# Patient Record
Sex: Female | Born: 1965 | Race: White | Hispanic: No | Marital: Single | Smoking: Never smoker
Health system: Southern US, Community
[De-identification: ages and names within clinical notes are randomized; demographics above are authoritative.]

## PROBLEM LIST (undated history)

## (undated) DIAGNOSIS — L80 Vitiligo: Secondary | ICD-10-CM

## (undated) DIAGNOSIS — D219 Benign neoplasm of connective and other soft tissue, unspecified: Secondary | ICD-10-CM

## (undated) DIAGNOSIS — F32A Depression, unspecified: Secondary | ICD-10-CM

## (undated) DIAGNOSIS — Z9189 Other specified personal risk factors, not elsewhere classified: Secondary | ICD-10-CM

## (undated) DIAGNOSIS — M8580 Other specified disorders of bone density and structure, unspecified site: Secondary | ICD-10-CM

## (undated) DIAGNOSIS — F909 Attention-deficit hyperactivity disorder, unspecified type: Secondary | ICD-10-CM

## (undated) DIAGNOSIS — K635 Polyp of colon: Secondary | ICD-10-CM

## (undated) DIAGNOSIS — Z9109 Other allergy status, other than to drugs and biological substances: Secondary | ICD-10-CM

## (undated) DIAGNOSIS — F329 Major depressive disorder, single episode, unspecified: Secondary | ICD-10-CM

## (undated) HISTORY — PX: APPENDECTOMY: SHX54

## (undated) HISTORY — DX: Depression, unspecified: F32.A

## (undated) HISTORY — DX: Benign neoplasm of connective and other soft tissue, unspecified: D21.9

## (undated) HISTORY — DX: Major depressive disorder, single episode, unspecified: F32.9

## (undated) HISTORY — DX: Polyp of colon: K63.5

## (undated) HISTORY — DX: Other specified personal risk factors, not elsewhere classified: Z91.89

## (undated) HISTORY — DX: Vitiligo: L80

## (undated) HISTORY — DX: Other specified disorders of bone density and structure, unspecified site: M85.80

## (undated) HISTORY — PX: MOUTH SURGERY: SHX715

## (undated) HISTORY — DX: Other allergy status, other than to drugs and biological substances: Z91.09

## (undated) HISTORY — DX: Attention-deficit hyperactivity disorder, unspecified type: F90.9

---

## 2006-07-07 ENCOUNTER — Other Ambulatory Visit: Admission: RE | Admit: 2006-07-07 | Discharge: 2006-07-07 | Payer: Self-pay | Admitting: Gynecology

## 2007-07-21 ENCOUNTER — Other Ambulatory Visit: Admission: RE | Admit: 2007-07-21 | Discharge: 2007-07-21 | Payer: Self-pay | Admitting: Gynecology

## 2007-09-08 ENCOUNTER — Ambulatory Visit (HOSPITAL_COMMUNITY): Admission: RE | Admit: 2007-09-08 | Discharge: 2007-09-08 | Payer: Self-pay | Admitting: Gynecology

## 2007-09-20 ENCOUNTER — Encounter: Admission: RE | Admit: 2007-09-20 | Discharge: 2007-09-20 | Payer: Self-pay | Admitting: Gynecology

## 2008-07-31 ENCOUNTER — Ambulatory Visit: Payer: Self-pay | Admitting: Gynecology

## 2008-07-31 ENCOUNTER — Other Ambulatory Visit: Admission: RE | Admit: 2008-07-31 | Discharge: 2008-07-31 | Payer: Self-pay | Admitting: Gynecology

## 2008-07-31 ENCOUNTER — Encounter: Payer: Self-pay | Admitting: Gynecology

## 2008-08-05 ENCOUNTER — Ambulatory Visit: Payer: Self-pay | Admitting: Gynecology

## 2008-10-04 ENCOUNTER — Ambulatory Visit (HOSPITAL_COMMUNITY): Admission: RE | Admit: 2008-10-04 | Discharge: 2008-10-04 | Payer: Self-pay | Admitting: Gynecology

## 2009-08-04 ENCOUNTER — Other Ambulatory Visit: Admission: RE | Admit: 2009-08-04 | Discharge: 2009-08-04 | Payer: Self-pay | Admitting: Gynecology

## 2009-08-04 ENCOUNTER — Ambulatory Visit: Payer: Self-pay | Admitting: Gynecology

## 2009-10-23 ENCOUNTER — Ambulatory Visit (HOSPITAL_COMMUNITY): Admission: RE | Admit: 2009-10-23 | Discharge: 2009-10-23 | Payer: Self-pay | Admitting: Gynecology

## 2009-11-30 ENCOUNTER — Emergency Department (HOSPITAL_COMMUNITY): Admission: AC | Admit: 2009-11-30 | Discharge: 2009-11-30 | Payer: Self-pay | Admitting: Emergency Medicine

## 2009-12-21 ENCOUNTER — Ambulatory Visit (HOSPITAL_COMMUNITY): Admission: RE | Admit: 2009-12-21 | Discharge: 2009-12-21 | Payer: Self-pay | Admitting: Chiropractic Medicine

## 2010-02-14 DIAGNOSIS — K635 Polyp of colon: Secondary | ICD-10-CM

## 2010-02-14 HISTORY — DX: Polyp of colon: K63.5

## 2010-05-17 DIAGNOSIS — M8580 Other specified disorders of bone density and structure, unspecified site: Secondary | ICD-10-CM

## 2010-05-17 HISTORY — DX: Other specified disorders of bone density and structure, unspecified site: M85.80

## 2010-06-07 ENCOUNTER — Encounter: Payer: Self-pay | Admitting: Gynecology

## 2010-08-26 ENCOUNTER — Other Ambulatory Visit (HOSPITAL_COMMUNITY)
Admission: RE | Admit: 2010-08-26 | Discharge: 2010-08-26 | Disposition: A | Discharge status: Home / Self Care | Payer: 59 | Source: Ambulatory Visit | Attending: Gynecology | Admitting: Gynecology

## 2010-08-26 ENCOUNTER — Other Ambulatory Visit: Payer: Self-pay | Admitting: Gynecology

## 2010-08-26 ENCOUNTER — Encounter (INDEPENDENT_AMBULATORY_CARE_PROVIDER_SITE_OTHER): Payer: Self-pay | Admitting: Gynecology

## 2010-08-26 DIAGNOSIS — Z124 Encounter for screening for malignant neoplasm of cervix: Secondary | ICD-10-CM | POA: Insufficient documentation

## 2010-08-26 DIAGNOSIS — Z13220 Encounter for screening for lipoid disorders: Secondary | ICD-10-CM

## 2010-08-26 DIAGNOSIS — Z01419 Encounter for gynecological examination (general) (routine) without abnormal findings: Secondary | ICD-10-CM

## 2010-09-01 ENCOUNTER — Encounter (INDEPENDENT_AMBULATORY_CARE_PROVIDER_SITE_OTHER): Payer: 59

## 2010-09-01 DIAGNOSIS — M899 Disorder of bone, unspecified: Secondary | ICD-10-CM

## 2010-09-08 ENCOUNTER — Other Ambulatory Visit: Payer: Self-pay | Admitting: Gynecology

## 2010-09-08 ENCOUNTER — Ambulatory Visit (INDEPENDENT_AMBULATORY_CARE_PROVIDER_SITE_OTHER): Payer: 59 | Admitting: Gynecology

## 2010-09-08 DIAGNOSIS — N9089 Other specified noninflammatory disorders of vulva and perineum: Secondary | ICD-10-CM

## 2010-10-06 ENCOUNTER — Other Ambulatory Visit: Payer: Self-pay | Admitting: Gynecology

## 2010-10-06 DIAGNOSIS — Z1231 Encounter for screening mammogram for malignant neoplasm of breast: Secondary | ICD-10-CM

## 2010-10-26 ENCOUNTER — Ambulatory Visit (HOSPITAL_COMMUNITY)
Admission: RE | Admit: 2010-10-26 | Discharge: 2010-10-26 | Disposition: A | Discharge status: Home / Self Care | Payer: 59 | Source: Ambulatory Visit | Attending: Gynecology | Admitting: Gynecology

## 2010-10-26 DIAGNOSIS — Z1231 Encounter for screening mammogram for malignant neoplasm of breast: Secondary | ICD-10-CM | POA: Insufficient documentation

## 2011-05-30 ENCOUNTER — Other Ambulatory Visit: Payer: Self-pay | Admitting: Gynecology

## 2011-08-26 ENCOUNTER — Encounter: Payer: Self-pay | Admitting: *Deleted

## 2011-09-02 ENCOUNTER — Ambulatory Visit (INDEPENDENT_AMBULATORY_CARE_PROVIDER_SITE_OTHER): Payer: BC Managed Care – PPO | Admitting: Gynecology

## 2011-09-02 ENCOUNTER — Encounter: Payer: Self-pay | Admitting: Gynecology

## 2011-09-02 VITALS — BP 130/80 | Ht 67.5 in | Wt 181.0 lb

## 2011-09-02 DIAGNOSIS — Z01419 Encounter for gynecological examination (general) (routine) without abnormal findings: Secondary | ICD-10-CM

## 2011-09-02 MED ORDER — NORETHINDRONE ACET-ETHINYL EST 1-20 MG-MCG PO TABS: 1.0000 | ORAL_TABLET | Freq: Every day | ORAL | Status: DC

## 2011-09-02 NOTE — Patient Instructions (Signed)
Follow up in one year for your annual gynecologic exam. 

## 2011-09-02 NOTE — Progress Notes (Signed)
Eileen Donovan [birth year] [id number]        46 y.o.  for annual exam.  Doing well.  Past medical history,surgical history, medications, allergies, family history and social history were all reviewed and documented in the EPIC chart. ROS:  Was performed and pertinent positives and negatives are included in the history.  Exam: Amy chaperone present Filed Vitals:   09/02/11 1546  BP: 130/80   General appearance  Normal Skin grossly normal Head/Neck normal with no cervical or supraclavicular adenopathy thyroid normal Lungs  clear Cardiac RR, without RMG Abdominal  soft, nontender, without masses, organomegaly or hernia Breasts  examined lying and sitting without masses, retractions, discharge or axillary adenopathy. Pelvic  Ext/BUS/vagina  normal   Cervix  normal    Uterus  anteverted, normal size, shape and contour, midline and mobile nontender   Adnexa  Without masses or tenderness    Anus and perineum  normal   Rectovaginal  normal sphincter tone without palpated masses or tenderness.    Assessment/Plan:  46 y.o. female for annual exam.    1. Oral contraceptives. Patient using Loestrin 120 equivalent doing well wants to continue. I reviewed the risks particularly with advancing age to include stroke heart attack DVT. She is not being followed for her medical issues, does not smoke and accepts the risks and I refilled her times a year. 2. Breast health. SBE monthly reviewed. She is due for mammogram in June and I reminded her to schedule this and she'll continue with annual mammography. 3. Pap smear. Her last Pap smear was 2012. She has no history of abnormal Pap smears before with several normal records in her chart. I reviewed current screening guidelines we'll plan on every three-year Pap smears and no Pap smear was done today. 4. Health maintenance. No blood work was done today she reports having all done through her primary physician's office. She will see me in a year assuming she  continues well, sooner as needed.    Dara Lords MD, 4:28 PM 09/02/2011

## 2011-12-13 ENCOUNTER — Other Ambulatory Visit: Payer: Self-pay | Admitting: Gynecology

## 2011-12-13 DIAGNOSIS — Z1231 Encounter for screening mammogram for malignant neoplasm of breast: Secondary | ICD-10-CM

## 2011-12-30 ENCOUNTER — Ambulatory Visit (HOSPITAL_COMMUNITY)
Admission: RE | Admit: 2011-12-30 | Discharge: 2011-12-30 | Disposition: A | Discharge status: Home / Self Care | Payer: BC Managed Care – PPO | Source: Ambulatory Visit | Attending: Gynecology | Admitting: Gynecology

## 2011-12-30 DIAGNOSIS — Z1231 Encounter for screening mammogram for malignant neoplasm of breast: Secondary | ICD-10-CM

## 2012-03-14 ENCOUNTER — Other Ambulatory Visit: Payer: Self-pay | Admitting: Gynecology

## 2012-04-10 ENCOUNTER — Other Ambulatory Visit: Payer: Self-pay | Admitting: Gynecology

## 2012-10-10 ENCOUNTER — Other Ambulatory Visit: Payer: Self-pay | Admitting: Gynecology

## 2012-10-24 ENCOUNTER — Ambulatory Visit (INDEPENDENT_AMBULATORY_CARE_PROVIDER_SITE_OTHER): Payer: BC Managed Care – PPO | Admitting: Gynecology

## 2012-10-24 ENCOUNTER — Encounter: Payer: Self-pay | Admitting: Gynecology

## 2012-10-24 VITALS — BP 120/70 | Ht 67.5 in | Wt 188.0 lb

## 2012-10-24 DIAGNOSIS — M899 Disorder of bone, unspecified: Secondary | ICD-10-CM

## 2012-10-24 DIAGNOSIS — M949 Disorder of cartilage, unspecified: Secondary | ICD-10-CM

## 2012-10-24 DIAGNOSIS — M8580 Other specified disorders of bone density and structure, unspecified site: Secondary | ICD-10-CM

## 2012-10-24 DIAGNOSIS — Z01419 Encounter for gynecological examination (general) (routine) without abnormal findings: Secondary | ICD-10-CM

## 2012-10-24 MED ORDER — NORETHINDRONE ACET-ETHINYL EST 1-20 MG-MCG PO TABS: 1.0000 | ORAL_TABLET | Freq: Every day | ORAL | Status: DC

## 2012-10-24 NOTE — Progress Notes (Signed)
EMERALD SHOR [birth year] [id number]        47 y.o.  G0P0 for annual exam.  Doing well without complaints.  Past medical history,surgical history, medications, allergies, family history and social history were all reviewed and documented in the EPIC chart.  ROS:  Performed and pertinent positives and negatives are included in the history, assessment and plan .  Exam: Kim assistant Filed Vitals:   10/24/12 1544  BP: 120/70  Height: 5' 7.5" (1.715 m)  Weight: 188 lb (85.276 kg)   General appearance  Normal Skin grossly normal Head/Neck normal with no cervical or supraclavicular adenopathy thyroid normal Lungs  clear Cardiac RR, without RMG Abdominal  soft, nontender, without masses, organomegaly or hernia Breasts  examined lying and sitting without masses, retractions, discharge or axillary adenopathy. Pelvic  Ext/BUS/vagina  normal   Cervix  normal   Uterus  anteverted, normal size, shape and contour, midline and mobile nontender   Adnexa  Without masses or tenderness    Anus and perineum  normal   Rectovaginal  normal sphincter tone without palpated masses or tenderness.    Assessment/Plan:  47 y.o. G0P0 female for annual exam, regular menses, birth control pills.   1. Oral contraceptives. Patient is on Loestrin 120 equivalents doing well wants to continue. I again reviewed the risks to include increased risk of stroke heart attack DVT possible increased risk with age. She is not being followed for any medical issues nor ever smoked. Understands accepts risks and I refilled her times a year. 2. Osteopenia. DEXA 2012 T score -1.6 FRAX 2.5%/0.2% stable from 2008 DEXA. Increase calcium and vitamin D. Plan expectant management with repeat DEXA in a 4 to five-year interval. 3. Mammography 12/2011. Repeat mammogram this coming fall. SBE monthly reviewed. 4. Health maintenance. Patient has her lab work done through Dr. Jone Baseman office. Followup one year, sooner as  needed.    Dara Lords MD, 4:20 PM 10/24/2012

## 2012-10-24 NOTE — Patient Instructions (Signed)
Follow up one year for annual exam 

## 2012-10-25 ENCOUNTER — Encounter: Payer: Self-pay | Admitting: Gynecology

## 2012-10-30 ENCOUNTER — Other Ambulatory Visit: Payer: Self-pay | Admitting: Gynecology

## 2013-01-08 ENCOUNTER — Other Ambulatory Visit: Payer: Self-pay | Admitting: Gynecology

## 2013-01-08 DIAGNOSIS — Z1231 Encounter for screening mammogram for malignant neoplasm of breast: Secondary | ICD-10-CM

## 2013-01-17 ENCOUNTER — Ambulatory Visit (HOSPITAL_COMMUNITY)
Admission: RE | Admit: 2013-01-17 | Discharge: 2013-01-17 | Disposition: A | Discharge status: Home / Self Care | Payer: BC Managed Care – PPO | Source: Ambulatory Visit | Attending: Gynecology | Admitting: Gynecology

## 2013-01-17 DIAGNOSIS — Z1231 Encounter for screening mammogram for malignant neoplasm of breast: Secondary | ICD-10-CM | POA: Insufficient documentation

## 2013-11-22 ENCOUNTER — Encounter: Payer: Self-pay | Admitting: Gynecology

## 2013-11-22 ENCOUNTER — Other Ambulatory Visit (HOSPITAL_COMMUNITY)
Admission: RE | Admit: 2013-11-22 | Discharge: 2013-11-22 | Disposition: A | Discharge status: Home / Self Care | Payer: BC Managed Care – PPO | Source: Ambulatory Visit | Attending: Gynecology | Admitting: Gynecology

## 2013-11-22 ENCOUNTER — Ambulatory Visit (INDEPENDENT_AMBULATORY_CARE_PROVIDER_SITE_OTHER): Payer: BC Managed Care – PPO | Admitting: Gynecology

## 2013-11-22 VITALS — BP 120/80 | Ht 68.0 in | Wt 197.0 lb

## 2013-11-22 DIAGNOSIS — Z1151 Encounter for screening for human papillomavirus (HPV): Secondary | ICD-10-CM | POA: Insufficient documentation

## 2013-11-22 DIAGNOSIS — M899 Disorder of bone, unspecified: Secondary | ICD-10-CM

## 2013-11-22 DIAGNOSIS — M949 Disorder of cartilage, unspecified: Secondary | ICD-10-CM

## 2013-11-22 DIAGNOSIS — Z01419 Encounter for gynecological examination (general) (routine) without abnormal findings: Secondary | ICD-10-CM

## 2013-11-22 DIAGNOSIS — M8580 Other specified disorders of bone density and structure, unspecified site: Secondary | ICD-10-CM

## 2013-11-22 MED ORDER — NORETHINDRONE ACET-ETHINYL EST 1-20 MG-MCG PO TABS: 1.0000 | ORAL_TABLET | Freq: Every day | ORAL | Status: DC

## 2013-11-22 NOTE — Progress Notes (Signed)
Eileen Donovan [birth year] [id number]        48 y.o.  G0P0 for annual exam.  Doing well without complaints.  Past medical history,surgical history, problem list, medications, allergies, family history and social history were all reviewed and documented as reviewed in the EPIC chart.  ROS:  12 system ROS performed with pertinent positives and negatives included in the history, assessment and plan.   Additional significant findings :  None   Exam: Kim Counsellor Vitals:   11/22/13 1558  BP: 120/80  Height: 5\' 8"  (1.727 m)  Weight: 197 lb (89.359 kg)   General appearance:  Normal affect, orientation and appearance. Skin: Grossly normal HEENT: Without gross lesions.  No cervical or supraclavicular adenopathy. Thyroid normal.  Lungs:  Clear without wheezing, rales or rhonchi Cardiac: RR, without RMG Abdominal:  Soft, nontender, without masses, guarding, rebound, organomegaly or hernia Breasts:  Examined lying and sitting without masses, retractions, discharge or axillary adenopathy. Pelvic:  Ext/BUS/vagina normal  Cervix normal. Pap/HPV  Uterus anteverted, normal size, shape and contour, midline and mobile nontender   Adnexa  Without masses or tenderness    Anus and perineum  Normal   Rectovaginal  Normal sphincter tone without palpated masses or tenderness.    Assessment/Plan:  48 y.o. G0P0 female for annual exam with regular menses, oral contraceptives.   1. Birth control pills. Patient on Loestrin 06/06/1999. Doing well wants to continue. I again reviewed the risks to include increased risk of thrombosis such as stroke, heart attack, DVT. Patient never smoked, without other medical issues, understands and accepts the risks and I refilled her times a year. 2. Osteopenia. DEXA 2012 T score -1.6 FRAX 2.5%/0.2%. Stable from prior DEXA 2008. We'll plan repeating at a 5 year interval. Increase calcium vitamin D reviewed. 3. Mammography 01/2013. Will continue with annual mammography  when due. SBE monthly discussed. Pap smear 2012. Pap/HPV today. No history of abnormal Pap smears previously. Plan repeat in 3-5 years interval current screening guidelines. 4. Colonoscopy 2014. Followup on prior polyps. Repeat at their recommended interval. 5. Health maintenance. No blood work done as this is done through her primary physician's office. Followup one year, sooner as needed.   Note: This document was prepared with digital dictation and possible smart phrase technology. Any transcriptional errors that result from this process are unintentional.   Anastasio Auerbach MD, 4:35 PM 11/22/2013

## 2013-11-22 NOTE — Patient Instructions (Signed)
Followup in one year for annual exam, sooner if any issues.  You may obtain a copy of any labs that were done today by logging onto MyChart as outlined in the instructions provided with your AVS (after visit summary). The office will not call with normal lab results but certainly if there are any significant abnormalities then we will contact you.   Health Maintenance, Female A healthy lifestyle and preventative care can promote health and wellness.  Maintain regular health, dental, and eye exams.  Eat a healthy diet. Foods like vegetables, fruits, whole grains, low-fat dairy products, and lean protein foods contain the nutrients you need without too many calories. Decrease your intake of foods high in solid fats, added sugars, and salt. Get information about a proper diet from your caregiver, if necessary.  Regular physical exercise is one of the most important things you can do for your health. Most adults should get at least 150 minutes of moderate-intensity exercise (any activity that increases your heart rate and causes you to sweat) each week. In addition, most adults need muscle-strengthening exercises on 2 or more days a week.   Maintain a healthy weight. The body mass index (BMI) is a screening tool to identify possible weight problems. It provides an estimate of body fat based on height and weight. Your caregiver can help determine your BMI, and can help you achieve or maintain a healthy weight. For adults 20 years and older:  A BMI below 18.5 is considered underweight.  A BMI of 18.5 to 24.9 is normal.  A BMI of 25 to 29.9 is considered overweight.  A BMI of 30 and above is considered obese.  Maintain normal blood lipids and cholesterol by exercising and minimizing your intake of saturated fat. Eat a balanced diet with plenty of fruits and vegetables. Blood tests for lipids and cholesterol should begin at age 20 and be repeated every 5 years. If your lipid or cholesterol levels are  high, you are over 50, or you are a high risk for heart disease, you may need your cholesterol levels checked more frequently.Ongoing high lipid and cholesterol levels should be treated with medicines if diet and exercise are not effective.  If you smoke, find out from your caregiver how to quit. If you do not use tobacco, do not start.  Lung cancer screening is recommended for adults aged 55 80 years who are at high risk for developing lung cancer because of a history of smoking. Yearly low-dose computed tomography (CT) is recommended for people who have at least a 30-pack-year history of smoking and are a current smoker or have quit within the past 15 years. A pack year of smoking is smoking an average of 1 pack of cigarettes a day for 1 year (for example: 1 pack a day for 30 years or 2 packs a day for 15 years). Yearly screening should continue until the smoker has stopped smoking for at least 15 years. Yearly screening should also be stopped for people who develop a health problem that would prevent them from having lung cancer treatment.  If you are pregnant, do not drink alcohol. If you are breastfeeding, be very cautious about drinking alcohol. If you are not pregnant and choose to drink alcohol, do not exceed 1 drink per day. One drink is considered to be 12 ounces (355 mL) of beer, 5 ounces (148 mL) of wine, or 1.5 ounces (44 mL) of liquor.  Avoid use of street drugs. Do not share needles with   anyone. Ask for help if you need support or instructions about stopping the use of drugs.  High blood pressure causes heart disease and increases the risk of stroke. Blood pressure should be checked at least every 1 to 2 years. Ongoing high blood pressure should be treated with medicines, if weight loss and exercise are not effective.  If you are 55 to 46 years old, ask your caregiver if you should take aspirin to prevent strokes.  Diabetes screening involves taking a blood sample to check your fasting  blood sugar level. This should be done once every 3 years, after age 45, if you are within normal weight and without risk factors for diabetes. Testing should be considered at a younger age or be carried out more frequently if you are overweight and have at least 1 risk factor for diabetes.  Breast cancer screening is essential preventative care for women. You should practice "breast self-awareness." This means understanding the normal appearance and feel of your breasts and may include breast self-examination. Any changes detected, no matter how small, should be reported to a caregiver. Women in their 20s and 30s should have a clinical breast exam (CBE) by a caregiver as part of a regular health exam every 1 to 3 years. After age 40, women should have a CBE every year. Starting at age 40, women should consider having a mammogram (breast X-ray) every year. Women who have a family history of breast cancer should talk to their caregiver about genetic screening. Women at a high risk of breast cancer should talk to their caregiver about having an MRI and a mammogram every year.  Breast cancer gene (BRCA)-related cancer risk assessment is recommended for women who have family members with BRCA-related cancers. BRCA-related cancers include breast, ovarian, tubal, and peritoneal cancers. Having family members with these cancers may be associated with an increased risk for harmful changes (mutations) in the breast cancer genes BRCA1 and BRCA2. Results of the assessment will determine the need for genetic counseling and BRCA1 and BRCA2 testing.  The Pap test is a screening test for cervical cancer. Women should have a Pap test starting at age 21. Between ages 21 and 29, Pap tests should be repeated every 2 years. Beginning at age 30, you should have a Pap test every 3 years as long as the past 3 Pap tests have been normal. If you had a hysterectomy for a problem that was not cancer or a condition that could lead to  cancer, then you no longer need Pap tests. If you are between ages 65 and 70, and you have had normal Pap tests going back 10 years, you no longer need Pap tests. If you have had past treatment for cervical cancer or a condition that could lead to cancer, you need Pap tests and screening for cancer for at least 20 years after your treatment. If Pap tests have been discontinued, risk factors (such as a new sexual partner) need to be reassessed to determine if screening should be resumed. Some women have medical problems that increase the chance of getting cervical cancer. In these cases, your caregiver may recommend more frequent screening and Pap tests.  The human papillomavirus (HPV) test is an additional test that may be used for cervical cancer screening. The HPV test looks for the virus that can cause the cell changes on the cervix. The cells collected during the Pap test can be tested for HPV. The HPV test could be used to screen women aged 30   years and older, and should be used in women of any age who have unclear Pap test results. After the age of 30, women should have HPV testing at the same frequency as a Pap test.  Colorectal cancer can be detected and often prevented. Most routine colorectal cancer screening begins at the age of 50 and continues through age 75. However, your caregiver may recommend screening at an earlier age if you have risk factors for colon cancer. On a yearly basis, your caregiver may provide home test kits to check for hidden blood in the stool. Use of a small camera at the end of a tube, to directly examine the colon (sigmoidoscopy or colonoscopy), can detect the earliest forms of colorectal cancer. Talk to your caregiver about this at age 50, when routine screening begins. Direct examination of the colon should be repeated every 5 to 10 years through age 75, unless early forms of pre-cancerous polyps or small growths are found.  Hepatitis C blood testing is recommended for  all people born from 1945 through 1965 and any individual with known risks for hepatitis C.  Practice safe sex. Use condoms and avoid high-risk sexual practices to reduce the spread of sexually transmitted infections (STIs). Sexually active women aged 25 and younger should be checked for Chlamydia, which is a common sexually transmitted infection. Older women with new or multiple partners should also be tested for Chlamydia. Testing for other STIs is recommended if you are sexually active and at increased risk.  Osteoporosis is a disease in which the bones lose minerals and strength with aging. This can result in serious bone fractures. The risk of osteoporosis can be identified using a bone density scan. Women ages 65 and over and women at risk for fractures or osteoporosis should discuss screening with their caregivers. Ask your caregiver whether you should be taking a calcium supplement or vitamin D to reduce the rate of osteoporosis.  Menopause can be associated with physical symptoms and risks. Hormone replacement therapy is available to decrease symptoms and risks. You should talk to your caregiver about whether hormone replacement therapy is right for you.  Use sunscreen. Apply sunscreen liberally and repeatedly throughout the day. You should seek shade when your shadow is shorter than you. Protect yourself by wearing long sleeves, pants, a wide-brimmed hat, and sunglasses year round, whenever you are outdoors.  Notify your caregiver of new moles or changes in moles, especially if there is a change in shape or color. Also notify your caregiver if a mole is larger than the size of a pencil eraser.  Stay current with your immunizations. Document Released: 11/16/2010 Document Revised: 08/28/2012 Document Reviewed: 11/16/2010 [employer] Patient Information 2014 [employer].   

## 2013-11-23 LAB — URINALYSIS W MICROSCOPIC + REFLEX CULTURE
Bilirubin Urine: NEGATIVE
Casts: NONE SEEN
Crystals: NONE SEEN
GLUCOSE, UA: NEGATIVE mg/dL
HGB URINE DIPSTICK: NEGATIVE
Ketones, ur: NEGATIVE mg/dL
LEUKOCYTES UA: NEGATIVE
NITRITE: NEGATIVE
PROTEIN: NEGATIVE mg/dL
SPECIFIC GRAVITY, URINE: 1.015 (ref 1.005–1.030)
SQUAMOUS EPITHELIAL / LPF: NONE SEEN
UROBILINOGEN UA: 1 mg/dL (ref 0.0–1.0)
pH: 7 (ref 5.0–8.0)

## 2013-11-26 ENCOUNTER — Other Ambulatory Visit: Payer: Self-pay | Admitting: Gynecology

## 2013-11-26 LAB — CYTOLOGY - PAP

## 2013-11-26 LAB — URINE CULTURE: Colony Count: >=100000

## 2013-11-26 MED ORDER — NITROFURANTOIN MONOHYD MACRO 100 MG PO CAPS: 100.0000 mg | ORAL_CAPSULE | Freq: Two times a day (BID) | ORAL | Status: DC

## 2014-01-08 ENCOUNTER — Other Ambulatory Visit: Payer: Self-pay | Admitting: Gynecology

## 2014-01-08 DIAGNOSIS — Z1231 Encounter for screening mammogram for malignant neoplasm of breast: Secondary | ICD-10-CM

## 2014-01-18 ENCOUNTER — Ambulatory Visit (HOSPITAL_COMMUNITY): Payer: BC Managed Care – PPO

## 2014-04-12 ENCOUNTER — Ambulatory Visit (HOSPITAL_COMMUNITY)
Admission: RE | Admit: 2014-04-12 | Discharge: 2014-04-12 | Disposition: A | Discharge status: Home / Self Care | Payer: Medicare HMO | Source: Ambulatory Visit | Attending: Gynecology | Admitting: Gynecology

## 2014-04-12 DIAGNOSIS — Z1231 Encounter for screening mammogram for malignant neoplasm of breast: Secondary | ICD-10-CM

## 2014-12-17 ENCOUNTER — Ambulatory Visit (INDEPENDENT_AMBULATORY_CARE_PROVIDER_SITE_OTHER): Payer: Managed Care, Other (non HMO) | Admitting: Gynecology

## 2014-12-17 ENCOUNTER — Encounter: Payer: Self-pay | Admitting: Gynecology

## 2014-12-17 VITALS — BP 110/70 | Ht 68.0 in | Wt 178.0 lb

## 2014-12-17 DIAGNOSIS — Z01419 Encounter for gynecological examination (general) (routine) without abnormal findings: Secondary | ICD-10-CM

## 2014-12-17 MED ORDER — NORETHINDRONE ACET-ETHINYL EST 1-20 MG-MCG PO TABS: 1.0000 | ORAL_TABLET | Freq: Every day | ORAL | Status: DC

## 2014-12-17 NOTE — Addendum Note (Signed)
Addended by: Nelva Nay on: 12/17/2014 04:43 PM   Modules accepted: Medications

## 2014-12-17 NOTE — Progress Notes (Signed)
Eileen Donovan 1965-06-20 [id number]        49 y.o.  G0P0 for annual exam.  Doing well without complaints.  Past medical history,surgical history, problem list, medications, allergies, family history and social history were all reviewed and documented as reviewed in the EPIC chart.  ROS:  Performed with pertinent positives and negatives included in the history, assessment and plan.   Additional significant findings :  none   Exam: Kim Counsellor Vitals:   12/17/14 1558  BP: 110/70  Height: 5\' 8"  (1.727 m)  Weight: 178 lb (80.74 kg)   General appearance:  Normal affect, orientation and appearance. Skin: Grossly normal HEENT: Without gross lesions.  No cervical or supraclavicular adenopathy. Thyroid normal.  Lungs:  Clear without wheezing, rales or rhonchi Cardiac: RR, without RMG Abdominal:  Soft, nontender, without masses, guarding, rebound, organomegaly or hernia Breasts:  Examined lying and sitting without masses, retractions, discharge or axillary adenopathy. Pelvic:  Ext/BUS/vagina normal  Cervix normal  Uterus anteverted, normal size, shape and contour, midline and mobile nontender   Adnexa  Without masses or tenderness    Anus and perineum  Normal   Rectovaginal  Normal sphincter tone without palpated masses or tenderness.    Assessment/Plan:  49 y.o. G0P0 female for annual exam with regular menses, oral contraceptives.   1. Oral contraceptives. Patient continues on Loestrin 1/20 equivalents. Doing well wants to continue.  Never smoked and not being followed for any medical issues. Increased risk of stroke heart attack DVT associated with oral contraceptives possibly increasing with age reviewed. Patient understands, accepts and I refilled her 1 year. 2. Osteopenia. DEXA 2012 T score -1.6. FRAX 2.5%/0.2%. Stable from prior DEXA. We have agreed to repeat at 5 year interval. 3. Mammography coming due end of November and I reminded her to schedule this. SBE monthly  reviewed. 4. Pap smear/HPV negative 2015. No Pap smear done today. No history of significant abnormal Pap smears. Plan repeat Pap smear in 3-5 year interval. 5. Colonoscopy 2014. Was done as follow up for polyps.  Repeat at their recommended interval. 6. Health maintenance. No routine blood work done as this is done at her primary physician's office. Follow up in one year, sooner as needed.   Anastasio Auerbach MD, 4:24 PM 12/17/2014

## 2014-12-17 NOTE — Patient Instructions (Signed)
You may obtain a copy of any labs that were done today by logging onto MyChart as outlined in the instructions provided with your AVS (after visit summary). The office will not call with normal lab results but certainly if there are any significant abnormalities then we will contact you.   Health Maintenance, Female A healthy lifestyle and preventative care can promote health and wellness.  Maintain regular health, dental, and eye exams.  Eat a healthy diet. Foods like vegetables, fruits, whole grains, low-fat dairy products, and lean protein foods contain the nutrients you need without too many calories. Decrease your intake of foods high in solid fats, added sugars, and salt. Get information about a proper diet from your caregiver, if necessary.  Regular physical exercise is one of the most important things you can do for your health. Most adults should get at least 150 minutes of moderate-intensity exercise (any activity that increases your heart rate and causes you to sweat) each week. In addition, most adults need muscle-strengthening exercises on 2 or more days a week.   Maintain a healthy weight. The body mass index (BMI) is a screening tool to identify possible weight problems. It provides an estimate of body fat based on height and weight. Your caregiver can help determine your BMI, and can help you achieve or maintain a healthy weight. For adults 20 years and older:  A BMI below 18.5 is considered underweight.  A BMI of 18.5 to 24.9 is normal.  A BMI of 25 to 29.9 is considered overweight.  A BMI of 30 and above is considered obese.  Maintain normal blood lipids and cholesterol by exercising and minimizing your intake of saturated fat. Eat a balanced diet with plenty of fruits and vegetables. Blood tests for lipids and cholesterol should begin at age 61 and be repeated every 5 years. If your lipid or cholesterol levels are high, you are over 50, or you are a high risk for heart  disease, you may need your cholesterol levels checked more frequently.Ongoing high lipid and cholesterol levels should be treated with medicines if diet and exercise are not effective.  If you smoke, find out from your caregiver how to quit. If you do not use tobacco, do not start.  Lung cancer screening is recommended for adults aged 33 80 years who are at high risk for developing lung cancer because of a history of smoking. Yearly low-dose computed tomography (CT) is recommended for people who have at least a 30-pack-year history of smoking and are a current smoker or have quit within the past 15 years. A pack year of smoking is smoking an average of 1 pack of cigarettes a day for 1 year (for example: 1 pack a day for 30 years or 2 packs a day for 15 years). Yearly screening should continue until the smoker has stopped smoking for at least 15 years. Yearly screening should also be stopped for people who develop a health problem that would prevent them from having lung cancer treatment.  If you are pregnant, do not drink alcohol. If you are breastfeeding, be very cautious about drinking alcohol. If you are not pregnant and choose to drink alcohol, do not exceed 1 drink per day. One drink is considered to be 12 ounces (355 mL) of beer, 5 ounces (148 mL) of wine, or 1.5 ounces (44 mL) of liquor.  Avoid use of street drugs. Do not share needles with anyone. Ask for help if you need support or instructions about stopping  the use of drugs.  High blood pressure causes heart disease and increases the risk of stroke. Blood pressure should be checked at least every 1 to 2 years. Ongoing high blood pressure should be treated with medicines, if weight loss and exercise are not effective.  If you are 59 to 47 years old, ask your caregiver if you should take aspirin to prevent strokes.  Diabetes screening involves taking a blood sample to check your fasting blood sugar level. This should be done once every 3  years, after age 91, if you are within normal weight and without risk factors for diabetes. Testing should be considered at a younger age or be carried out more frequently if you are overweight and have at least 1 risk factor for diabetes.  Breast cancer screening is essential preventative care for women. You should practice "breast self-awareness." This means understanding the normal appearance and feel of your breasts and may include breast self-examination. Any changes detected, no matter how small, should be reported to a caregiver. Women in their 66s and 30s should have a clinical breast exam (CBE) by a caregiver as part of a regular health exam every 1 to 3 years. After age 101, women should have a CBE every year. Starting at age 100, women should consider having a mammogram (breast X-ray) every year. Women who have a family history of breast cancer should talk to their caregiver about genetic screening. Women at a high risk of breast cancer should talk to their caregiver about having an MRI and a mammogram every year.  Breast cancer gene (BRCA)-related cancer risk assessment is recommended for women who have family members with BRCA-related cancers. BRCA-related cancers include breast, ovarian, tubal, and peritoneal cancers. Having family members with these cancers may be associated with an increased risk for harmful changes (mutations) in the breast cancer genes BRCA1 and BRCA2. Results of the assessment will determine the need for genetic counseling and BRCA1 and BRCA2 testing.  The Pap test is a screening test for cervical cancer. Women should have a Pap test starting at age 57. Between ages 25 and 35, Pap tests should be repeated every 2 years. Beginning at age 37, you should have a Pap test every 3 years as long as the past 3 Pap tests have been normal. If you had a hysterectomy for a problem that was not cancer or a condition that could lead to cancer, then you no longer need Pap tests. If you are  between ages 50 and 76, and you have had normal Pap tests going back 10 years, you no longer need Pap tests. If you have had past treatment for cervical cancer or a condition that could lead to cancer, you need Pap tests and screening for cancer for at least 20 years after your treatment. If Pap tests have been discontinued, risk factors (such as a new sexual partner) need to be reassessed to determine if screening should be resumed. Some women have medical problems that increase the chance of getting cervical cancer. In these cases, your caregiver may recommend more frequent screening and Pap tests.  The human papillomavirus (HPV) test is an additional test that may be used for cervical cancer screening. The HPV test looks for the virus that can cause the cell changes on the cervix. The cells collected during the Pap test can be tested for HPV. The HPV test could be used to screen women aged 44 years and older, and should be used in women of any age  who have unclear Pap test results. After the age of 55, women should have HPV testing at the same frequency as a Pap test.  Colorectal cancer can be detected and often prevented. Most routine colorectal cancer screening begins at the age of 44 and continues through age 20. However, your caregiver may recommend screening at an earlier age if you have risk factors for colon cancer. On a yearly basis, your caregiver may provide home test kits to check for hidden blood in the stool. Use of a small camera at the end of a tube, to directly examine the colon (sigmoidoscopy or colonoscopy), can detect the earliest forms of colorectal cancer. Talk to your caregiver about this at age 86, when routine screening begins. Direct examination of the colon should be repeated every 5 to 10 years through age 13, unless early forms of pre-cancerous polyps or small growths are found.  Hepatitis C blood testing is recommended for all people born from [birth year] through 1963 and any  individual with known risks for hepatitis C.  Practice safe sex. Use condoms and avoid high-risk sexual practices to reduce the spread of sexually transmitted infections (STIs). Sexually active women aged 36 and younger should be checked for Chlamydia, which is a common sexually transmitted infection. Older women with new or multiple partners should also be tested for Chlamydia. Testing for other STIs is recommended if you are sexually active and at increased risk.  Osteoporosis is a disease in which the bones lose minerals and strength with aging. This can result in serious bone fractures. The risk of osteoporosis can be identified using a bone density scan. Women ages 20 and over and women at risk for fractures or osteoporosis should discuss screening with their caregivers. Ask your caregiver whether you should be taking a calcium supplement or vitamin D to reduce the rate of osteoporosis.  Menopause can be associated with physical symptoms and risks. Hormone replacement therapy is available to decrease symptoms and risks. You should talk to your caregiver about whether hormone replacement therapy is right for you.  Use sunscreen. Apply sunscreen liberally and repeatedly throughout the day. You should seek shade when your shadow is shorter than you. Protect yourself by wearing long sleeves, pants, a wide-brimmed hat, and sunglasses year round, whenever you are outdoors.  Notify your caregiver of new moles or changes in moles, especially if there is a change in shape or color. Also notify your caregiver if a mole is larger than the size of a pencil eraser.  Stay current with your immunizations. Document Released: 11/16/2010 Document Revised: 08/28/2012 Document Reviewed: 11/16/2010 [facility] Patient Information 2014 [employer].

## 2014-12-18 LAB — URINALYSIS W MICROSCOPIC + REFLEX CULTURE
Bacteria, UA: NONE SEEN [HPF]
Bilirubin Urine: NEGATIVE
CRYSTALS: NONE SEEN [HPF]
Casts: NONE SEEN [LPF]
Glucose, UA: NEGATIVE
Hgb urine dipstick: NEGATIVE
Ketones, ur: NEGATIVE
Leukocytes, UA: NEGATIVE
NITRITE: NEGATIVE
PROTEIN: NEGATIVE
RBC / HPF: NONE SEEN RBC/HPF (ref <=2)
SPECIFIC GRAVITY, URINE: 1.006 (ref 1.001–1.035)
Squamous Epithelial / LPF: NONE SEEN [HPF] (ref <=5)
WBC, UA: NONE SEEN WBC/HPF (ref <=5)
YEAST: NONE SEEN [HPF]
pH: 6 (ref 5.0–8.0)

## 2015-06-05 ENCOUNTER — Other Ambulatory Visit: Payer: Self-pay

## 2015-06-05 DIAGNOSIS — Z1231 Encounter for screening mammogram for malignant neoplasm of breast: Secondary | ICD-10-CM

## 2015-06-20 ENCOUNTER — Ambulatory Visit
Admission: RE | Admit: 2015-06-20 | Discharge: 2015-06-20 | Disposition: A | Discharge status: Home / Self Care | Payer: 59 | Source: Ambulatory Visit

## 2015-06-20 DIAGNOSIS — Z1231 Encounter for screening mammogram for malignant neoplasm of breast: Secondary | ICD-10-CM

## 2015-11-15 ENCOUNTER — Other Ambulatory Visit: Payer: Self-pay | Admitting: Gynecology

## 2015-12-18 ENCOUNTER — Encounter: Payer: Self-pay | Admitting: Gynecology

## 2015-12-18 ENCOUNTER — Ambulatory Visit (INDEPENDENT_AMBULATORY_CARE_PROVIDER_SITE_OTHER): Payer: 59 | Admitting: Gynecology

## 2015-12-18 VITALS — BP 118/78 | Ht 68.0 in | Wt 182.0 lb

## 2015-12-18 DIAGNOSIS — M8580 Other specified disorders of bone density and structure, unspecified site: Secondary | ICD-10-CM

## 2015-12-18 DIAGNOSIS — Z01419 Encounter for gynecological examination (general) (routine) without abnormal findings: Secondary | ICD-10-CM

## 2015-12-18 NOTE — Progress Notes (Signed)
    Eileen Donovan Sep 15, 1965 [id number]        50 y.o.  G0P0  for annual exam.  Doing well without complaints.  Past medical history,surgical history, problem list, medications, allergies, family history and social history were all reviewed and documented as reviewed in the EPIC chart.  ROS:  Performed with pertinent positives and negatives included in the history, assessment and plan.   Additional significant findings :  None   Exam: Eileen Donovan assistant Vitals:   12/18/15 0828  BP: 118/78  Weight: 182 lb (82.6 kg)  Height: 5\' 8"  (1.727 m)   Body mass index is 27.67 kg/m.  General appearance:  Normal affect, orientation and appearance. Skin: Grossly normal HEENT: Without gross lesions.  No cervical or supraclavicular adenopathy. Thyroid normal.  Lungs:  Clear without wheezing, rales or rhonchi Cardiac: RR, without RMG Abdominal:  Soft, nontender, without masses, guarding, rebound, organomegaly or hernia Breasts:  Examined lying and sitting without masses, retractions, discharge or axillary adenopathy. Pelvic:  Ext/BUS/Vagina normal  Cervix normal  Uterus anteverted, normal size, shape and contour, midline and mobile nontender   Adnexa without masses or tenderness    Anus and perineum normal   Rectovaginal normal sphincter tone without palpated masses or tenderness.   Assessment/Plan:  50 y.o. G0P0 female for annual exam with regular menses, oral contraceptives.   1. Oral contraceptives. Patient continues on Loestrin 1/20 equivalents. Doing well once to continue. Never smoked and not being followed for any medical issues. Increased risk of thrombosis reviewed particular with advancing age. Patient's comfortable continuing. Refill 1 year provided. 2. Osteopenia. DEXA 2012 T score -1.6 FRAX 2.5%/0.2%. Repeat DEXA now at 5 year interval. Patient will schedule follow up for this. 3. Mammography 06/2015. Continue with annual mammography when due. SBE monthly reviewed. 4. Pap  smear/HPV negative 2015. No Pap smear done today. No history of significant abnormal Pap smears previously. Repeat Pap smear approaching 5 year interval per current screening guidelines. 5. Colonoscopy 2014. Was done due to polyps. Follow up per their recommended interval. 6. Maintenance. No routine lab work done as patient reports is done elsewhere. Follow up in one year, sooner as needed.   Eileen Auerbach MD, 2:32 PM 12/18/2015

## 2015-12-18 NOTE — Patient Instructions (Signed)

## 2016-01-20 ENCOUNTER — Ambulatory Visit (INDEPENDENT_AMBULATORY_CARE_PROVIDER_SITE_OTHER): Payer: 59

## 2016-01-20 ENCOUNTER — Encounter: Payer: Self-pay | Admitting: Gynecology

## 2016-01-20 ENCOUNTER — Other Ambulatory Visit: Payer: Self-pay | Admitting: Gynecology

## 2016-01-20 DIAGNOSIS — Z13820 Encounter for screening for osteoporosis: Secondary | ICD-10-CM

## 2016-01-20 DIAGNOSIS — M8580 Other specified disorders of bone density and structure, unspecified site: Secondary | ICD-10-CM

## 2016-03-09 ENCOUNTER — Encounter: Payer: Self-pay | Admitting: Gynecology

## 2016-03-09 NOTE — Telephone Encounter (Signed)
I would watch for now. If progressively worsening then office visit. If stable then I'd monitor. Usual menopausal symptoms including hot flushes night sweats, not necessarily heavier bleeding.

## 2016-06-15 ENCOUNTER — Other Ambulatory Visit: Payer: Self-pay | Admitting: Family Medicine

## 2016-06-15 DIAGNOSIS — Z1231 Encounter for screening mammogram for malignant neoplasm of breast: Secondary | ICD-10-CM

## 2016-07-05 ENCOUNTER — Encounter: Payer: Self-pay | Admitting: Radiology

## 2016-07-05 ENCOUNTER — Ambulatory Visit
Admission: RE | Admit: 2016-07-05 | Discharge: 2016-07-05 | Disposition: A | Discharge status: Home / Self Care | Payer: 59 | Source: Ambulatory Visit | Attending: Family Medicine | Admitting: Family Medicine

## 2016-07-05 DIAGNOSIS — Z1231 Encounter for screening mammogram for malignant neoplasm of breast: Secondary | ICD-10-CM

## 2016-09-23 ENCOUNTER — Other Ambulatory Visit: Payer: Self-pay

## 2016-09-23 MED ORDER — NORETHINDRONE ACET-ETHINYL EST 1-20 MG-MCG PO TABS: 1.0000 | ORAL_TABLET | Freq: Every day | ORAL | 0 refills | Status: DC

## 2016-12-20 ENCOUNTER — Encounter: Payer: Self-pay | Admitting: Gynecology

## 2016-12-20 ENCOUNTER — Ambulatory Visit (INDEPENDENT_AMBULATORY_CARE_PROVIDER_SITE_OTHER): Payer: 59 | Admitting: Gynecology

## 2016-12-20 VITALS — BP 116/74 | Ht 68.0 in | Wt 192.0 lb

## 2016-12-20 DIAGNOSIS — Z01419 Encounter for gynecological examination (general) (routine) without abnormal findings: Secondary | ICD-10-CM | POA: Diagnosis not present

## 2016-12-20 DIAGNOSIS — N951 Menopausal and female climacteric states: Secondary | ICD-10-CM

## 2016-12-20 NOTE — Progress Notes (Signed)
    Eileen Donovan [birth year] [id number]        51 y.o.  G0P0 for annual exam.  Also notes she is having some hot flushes during the night. She is having more cramping with her menses and some bloating that she never had before. Having light spotty menses during pill free week. No bleeding otherwise.  Past medical history,surgical history, problem list, medications, allergies, family history and social history were all reviewed and documented as reviewed in the EPIC chart.  ROS:  Performed with pertinent positives and negatives included in the history, assessment and plan.   Additional significant findings :  None   Exam: [name] assistant Vitals:   12/20/16 0800  BP: 116/74  Weight: 192 lb (87.1 kg)  Height: 5\' 8"  (1.727 m)   Body mass index is 29.19 kg/m.  General appearance:  Normal affect, orientation and appearance. Skin: Grossly normal HEENT: Without gross lesions.  No cervical or supraclavicular adenopathy. Thyroid normal.  Lungs:  Clear without wheezing, rales or rhonchi Cardiac: RR, without RMG Abdominal:  Soft, nontender, without masses, guarding, rebound, organomegaly or hernia Breasts:  Examined lying and sitting without masses, retractions, discharge or axillary adenopathy. Pelvic:  Ext, BUS, Vagina: Normal  Cervix: Normal  Uterus: Anteverted, normal size, shape and contour, midline and mobile nontender   Adnexa: Without masses or tenderness    Anus and perineum: Normal   Rectovaginal: Normal sphincter tone without palpated masses or tenderness.    Assessment/Plan:  51 y.o. G0P0 female for annual exam with regular menses, oral contraceptives.   1. Oral contraceptives. Continues on Loestrin 1/20 equivalent. Having some hot flushes. Mild cramping with bloating during her menses. Asked patient to keep calendar see when her hot flushes occur and whether they are during her pill free week. We'll also check end of pill free week TSH and FSH. Reviewed possibilities to  include the beginning of menopause. Will continue on oral contraceptives for now. Patient actually has several packs at home and will call when she needs refill for the birth control pills. Will follow up on the hormone levels. 2. Mammography 06/2016. Continue with annual mammography when due. Breast exam normal today. SBE monthly reviewed. 3. Osteopenia. DEXA 2017 T score -1.3.  FRAX 3.8%/0.3%. Previously was -1.6 in 2012. Plan repeat DEXA at 5 year interval. 4. Pap smear/HPV 2015. No Pap smear done today. No history of significant abnormal Pap smears previously. Plan repeat Pap smear at 5 year interval per current screening guidelines. 5. Colonoscopy 2014. Repeat at their recommended interval. 6. Health maintenance. No routine lab work done as patient does this elsewhere. Follow up 1 year, sooner as needed.   Anastasio Auerbach MD, 8:27 AM 12/20/2016

## 2016-12-20 NOTE — Patient Instructions (Signed)
Follow up for fasting blood work as scheduled

## 2017-01-14 ENCOUNTER — Other Ambulatory Visit: Payer: 59

## 2017-01-14 DIAGNOSIS — N951 Menopausal and female climacteric states: Secondary | ICD-10-CM

## 2017-01-15 LAB — FOLLICLE STIMULATING HORMONE: FSH: 23.5 m[IU]/mL

## 2017-01-15 LAB — TSH: TSH: 2.07 m[IU]/L

## 2017-01-18 ENCOUNTER — Encounter: Payer: Self-pay | Admitting: Gynecology

## 2017-04-29 ENCOUNTER — Other Ambulatory Visit: Payer: Self-pay | Admitting: Gynecology

## 2017-05-16 ENCOUNTER — Other Ambulatory Visit: Payer: Self-pay

## 2017-05-16 MED ORDER — NORETHINDRONE ACET-ETHINYL EST 1-20 MG-MCG PO TABS: 1.0000 | ORAL_TABLET | Freq: Every day | ORAL | 2 refills | Status: DC

## 2017-05-23 DIAGNOSIS — M9904 Segmental and somatic dysfunction of sacral region: Secondary | ICD-10-CM | POA: Diagnosis not present

## 2017-05-23 DIAGNOSIS — M9903 Segmental and somatic dysfunction of lumbar region: Secondary | ICD-10-CM | POA: Diagnosis not present

## 2017-05-23 DIAGNOSIS — M545 Low back pain: Secondary | ICD-10-CM | POA: Diagnosis not present

## 2017-05-26 DIAGNOSIS — M9903 Segmental and somatic dysfunction of lumbar region: Secondary | ICD-10-CM | POA: Diagnosis not present

## 2017-05-26 DIAGNOSIS — M545 Low back pain: Secondary | ICD-10-CM | POA: Diagnosis not present

## 2017-05-26 DIAGNOSIS — M9904 Segmental and somatic dysfunction of sacral region: Secondary | ICD-10-CM | POA: Diagnosis not present

## 2017-07-07 ENCOUNTER — Other Ambulatory Visit: Payer: Self-pay | Admitting: Gynecology

## 2017-07-07 DIAGNOSIS — Z1231 Encounter for screening mammogram for malignant neoplasm of breast: Secondary | ICD-10-CM

## 2017-07-28 ENCOUNTER — Encounter: Payer: Self-pay | Admitting: Radiology

## 2017-07-28 ENCOUNTER — Ambulatory Visit
Admission: RE | Admit: 2017-07-28 | Discharge: 2017-07-28 | Disposition: A | Discharge status: Home / Self Care | Payer: 59 | Source: Ambulatory Visit | Attending: Gynecology | Admitting: Gynecology

## 2017-07-28 DIAGNOSIS — Z1231 Encounter for screening mammogram for malignant neoplasm of breast: Secondary | ICD-10-CM | POA: Diagnosis not present

## 2017-08-02 DIAGNOSIS — Z719 Counseling, unspecified: Secondary | ICD-10-CM | POA: Diagnosis not present

## 2017-08-29 DIAGNOSIS — J018 Other acute sinusitis: Secondary | ICD-10-CM | POA: Diagnosis not present

## 2017-09-01 DIAGNOSIS — J0100 Acute maxillary sinusitis, unspecified: Secondary | ICD-10-CM | POA: Diagnosis not present

## 2017-09-13 DIAGNOSIS — Z0000 Encounter for general adult medical examination without abnormal findings: Secondary | ICD-10-CM | POA: Diagnosis not present

## 2017-09-13 DIAGNOSIS — Z136 Encounter for screening for cardiovascular disorders: Secondary | ICD-10-CM | POA: Diagnosis not present

## 2017-12-13 DIAGNOSIS — G479 Sleep disorder, unspecified: Secondary | ICD-10-CM | POA: Diagnosis not present

## 2017-12-21 ENCOUNTER — Encounter: Payer: Self-pay | Admitting: Gynecology

## 2017-12-21 ENCOUNTER — Ambulatory Visit (INDEPENDENT_AMBULATORY_CARE_PROVIDER_SITE_OTHER): Payer: 59 | Admitting: Gynecology

## 2017-12-21 VITALS — BP 116/74 | Ht 68.0 in | Wt 198.0 lb

## 2017-12-21 DIAGNOSIS — N9089 Other specified noninflammatory disorders of vulva and perineum: Secondary | ICD-10-CM

## 2017-12-21 DIAGNOSIS — Z01411 Encounter for gynecological examination (general) (routine) with abnormal findings: Secondary | ICD-10-CM

## 2017-12-21 DIAGNOSIS — Z1151 Encounter for screening for human papillomavirus (HPV): Secondary | ICD-10-CM

## 2017-12-21 NOTE — Patient Instructions (Signed)
Follow up for the colposcopy appointment

## 2017-12-21 NOTE — Progress Notes (Signed)
    Eileen Donovan 1966-01-14 [id number]        52 y.o.  G0P0 for annual gynecologic exam.  Overall doing well.  Patient notes little more depression which she wonders is related to the perimenopause.  She increased her Paxil and notes that this has helped her.  Continues on low-dose oral contraceptives.  Dyer last year was 23  Past medical history,surgical history, problem list, medications, allergies, family history and social history were all reviewed and documented as reviewed in the EPIC chart.  ROS:  Performed with pertinent positives and negatives included in the history, assessment and plan.   Additional significant findings : None   Exam: [name] assistant Vitals:   12/21/17 0800  BP: 116/74  Weight: 198 lb (89.8 kg)  Height: 5\' 8"  (1.727 m)   Body mass index is 30.11 kg/m.  General appearance:  Normal affect, orientation and appearance. Skin: Grossly normal HEENT: Without gross lesions.  No cervical or supraclavicular adenopathy. Thyroid normal.  Lungs:  Clear without wheezing, rales or rhonchi Cardiac: RR, without RMG Abdominal:  Soft, nontender, without masses, guarding, rebound, organomegaly or hernia Breasts:  Examined lying and sitting without masses, retractions, discharge or axillary adenopathy. Pelvic:  Ext, BUS, Vagina: With white plaque-like lesion upper inner right labia minora extending across the midline to the left  Cervix: Normal.  Pap smear/HPV  Uterus: Anteverted, normal size, shape and contour, midline and mobile nontender   Adnexa: Without masses or tenderness    Anus and perineum: Normal   Rectovaginal: Normal sphincter tone without palpated masses or tenderness.    Assessment/Plan:  52 y.o. G0P0 female for annual gynecologic exam with regular menses, oral contraceptives.   1. White plaque-like lesion.  Discussed differential to include hyperplastic, lichen sclerosis as well as VIN/vulvar cancer.  Recommended colposcopy appointment with biopsy  and patient will schedule this in follow-up for this. 2. Oral contraceptives.  We discussed continuing oral contraceptives in the perimenopausal timeframe.  Risks to include thrombosis versus benefits reviewed.  Not using this for contraception.  My recommendation would be to discontinue the pills for now and see how she does both from a bleeding standpoint and a menopausal symptom standpoint.  If irregular bleeding or significant symptoms then will readdress.  Patient is comfortable with this. 3. Osteopenia.  DEXA 2017 T score -1.3 FRAX 3.8% / 0.3%.  Prior DEXA 2012 T score -1.6.  We will plan repeat DEXA at 5-year interval. 4. Pap smear/HPV 2015.  Pap smear/HPV today noting plaque-like lesion on vulva.  No history of abnormal Pap smears previously. 5. Colonoscopy 2014.  Repeat at their recommended interval. 6. Health maintenance.  No routine lab work done as patient does this elsewhere.  Follow-up 1 year, sooner as needed.   Anastasio Auerbach MD, 8:23 AM 12/21/2017

## 2017-12-21 NOTE — Addendum Note (Signed)
Addended by: Nelva Nay on: 12/21/2017 09:05 AM   Modules accepted: Orders

## 2017-12-22 ENCOUNTER — Encounter: Payer: Self-pay | Admitting: Gynecology

## 2017-12-22 LAB — PAP IG AND HPV HIGH-RISK: HPV DNA High Risk: NOT DETECTED

## 2018-01-06 ENCOUNTER — Encounter: Payer: Self-pay | Admitting: Gynecology

## 2018-01-06 ENCOUNTER — Ambulatory Visit (INDEPENDENT_AMBULATORY_CARE_PROVIDER_SITE_OTHER): Payer: 59 | Admitting: Gynecology

## 2018-01-06 VITALS — BP 120/78

## 2018-01-06 DIAGNOSIS — L900 Lichen sclerosus et atrophicus: Secondary | ICD-10-CM | POA: Diagnosis not present

## 2018-01-06 DIAGNOSIS — N9089 Other specified noninflammatory disorders of vulva and perineum: Secondary | ICD-10-CM

## 2018-01-06 NOTE — Addendum Note (Signed)
Addended by: Anastasio Auerbach on: 01/06/2018 11:52 AM   Modules accepted: Orders

## 2018-01-06 NOTE — Patient Instructions (Signed)
Office will call you with biopsy results 

## 2018-01-06 NOTE — Progress Notes (Signed)
    Eileen Donovan 01/19/1966 [id number]        52 y.o.  G0P0 presents for colposcopy and vulvar biopsy.  Recently was seen at annual exam and was found to have a plaque like white lesion upper inner right labia minora.  No history of same before.  Pap smear returned normal with negative HPV.  Past medical history,surgical history, problem list, medications, allergies, family history and social history were all reviewed and documented in the EPIC chart.  Directed ROS with pertinent positives and negatives documented in the history of present illness/assessment and plan.  Exam: Eileen Donovan assistant Vitals:   01/06/18 1100  BP: 120/78   General appearance:  Normal External BUS vagina with plaque like white lesion upper inner labia minora.  Colposcopy performed after acetic acid cleanse shows acetowhite plaque-like lesion upper inner labia minora extending across the midline to the left.  No other abnormalities noted from the lower mons to the perianal region.  Physical Exam  Genitourinary:      Procedure: The skin overlying and surrounding the lesion was cleansed with Betadine and the lateral portion was infiltrated with 1% lidocaine.  A representative biopsy was taken as diagrammed.  Silver nitrate hemostasis applied.  Postoperative instructions discussed.  Assessment/Plan:  52 y.o. G0P0 with acetowhite plaque-like lesion upper inner right labia minora spilling over to the left side.  We discussed differential to include VIN and lichen sclerosus.  Patient will follow-up for biopsy results.  Various scenarios reviewed to include steroid cream, laser treatment and excisional procedure.    Eileen Auerbach MD, 11:32 AM 01/06/2018

## 2018-01-09 DIAGNOSIS — J300 Vasomotor rhinitis: Secondary | ICD-10-CM | POA: Diagnosis not present

## 2018-01-09 DIAGNOSIS — J309 Allergic rhinitis, unspecified: Secondary | ICD-10-CM | POA: Diagnosis not present

## 2018-01-10 ENCOUNTER — Other Ambulatory Visit: Payer: Self-pay | Admitting: Gynecology

## 2018-01-10 MED ORDER — CLOBETASOL PROPIONATE 0.05 % EX CREA: TOPICAL_CREAM | CUTANEOUS | 0 refills | Status: DC

## 2018-02-13 DIAGNOSIS — Z23 Encounter for immunization: Secondary | ICD-10-CM | POA: Diagnosis not present

## 2018-02-13 DIAGNOSIS — G479 Sleep disorder, unspecified: Secondary | ICD-10-CM | POA: Diagnosis not present

## 2018-02-17 ENCOUNTER — Encounter: Payer: Self-pay | Admitting: Gynecology

## 2018-02-17 ENCOUNTER — Ambulatory Visit (INDEPENDENT_AMBULATORY_CARE_PROVIDER_SITE_OTHER): Payer: 59 | Admitting: Gynecology

## 2018-02-17 VITALS — BP 118/76

## 2018-02-17 DIAGNOSIS — L900 Lichen sclerosus et atrophicus: Secondary | ICD-10-CM | POA: Diagnosis not present

## 2018-02-17 NOTE — Patient Instructions (Signed)
Follow-up in February for reexamination

## 2018-02-17 NOTE — Progress Notes (Signed)
    Eileen Donovan 1965-07-09 [id number]        52 y.o.  G0P0 presents in follow-up.  Presented for annual exam and August without complaints and was found to have a leukoplakic white area upper inner aspects of the labia minora above urethral opening.  Ultimately biopsy which showed lichen sclerosis.  Started on Temovate 0.05% cream nightly.  Has done well with this.  Past medical history,surgical history, problem list, medications, allergies, family history and social history were all reviewed and documented in the EPIC chart.  Directed ROS with pertinent positives and negatives documented in the history of present illness/assessment and plan.  Exam: Eileen Donovan assistant Vitals:   02/17/18 1015  BP: 118/76   General appearance:  Normal Abdomen soft nontender without masses guarding rebound Pelvic external BUS vagina with persistence of the white leukoplakic looking area as diagrammed.  Speculum exam shows cervix normal.  Uterus normal size midline mobile nontender.  Adnexa without masses or tenderness  Assessment/Plan:  52 y.o. G0P0 with:  1. Lichen sclerosus.  Stable from prior exam.  She was not symptomatic previously so she notices no change in any symptoms using the Temovate.  She will go ahead and wean herself off over the next month.  We will continue to follow expectantly and I asked her to come back in February for reexamination just to make sure nothing else develops or any concerning changes. 2. Stopped her birth control pills and has had one spontaneous menses.  Will monitor for now.  If significant irregular bleeding then may consider reinitiation for another year.  Currently not using them for contraception.  Risks of thrombosis with oral contraceptives discussed if we do need to reinitiate.    Eileen Auerbach MD, 10:34 AM 02/17/2018

## 2018-04-03 DIAGNOSIS — M9903 Segmental and somatic dysfunction of lumbar region: Secondary | ICD-10-CM | POA: Diagnosis not present

## 2018-04-03 DIAGNOSIS — M545 Low back pain: Secondary | ICD-10-CM | POA: Diagnosis not present

## 2018-04-03 DIAGNOSIS — M979XXD Periprosthetic fracture around unspecified internal prosthetic joint, subsequent encounter: Secondary | ICD-10-CM | POA: Diagnosis not present

## 2018-04-06 DIAGNOSIS — M979XXD Periprosthetic fracture around unspecified internal prosthetic joint, subsequent encounter: Secondary | ICD-10-CM | POA: Diagnosis not present

## 2018-04-06 DIAGNOSIS — M545 Low back pain: Secondary | ICD-10-CM | POA: Diagnosis not present

## 2018-04-06 DIAGNOSIS — M9903 Segmental and somatic dysfunction of lumbar region: Secondary | ICD-10-CM | POA: Diagnosis not present

## 2018-06-07 DIAGNOSIS — H43812 Vitreous degeneration, left eye: Secondary | ICD-10-CM | POA: Diagnosis not present

## 2018-06-07 DIAGNOSIS — H2513 Age-related nuclear cataract, bilateral: Secondary | ICD-10-CM | POA: Diagnosis not present

## 2018-06-07 DIAGNOSIS — H04123 Dry eye syndrome of bilateral lacrimal glands: Secondary | ICD-10-CM | POA: Diagnosis not present

## 2018-06-28 ENCOUNTER — Encounter: Payer: Self-pay | Admitting: Gynecology

## 2018-06-28 ENCOUNTER — Ambulatory Visit (INDEPENDENT_AMBULATORY_CARE_PROVIDER_SITE_OTHER): Payer: 59 | Admitting: Gynecology

## 2018-06-28 VITALS — BP 130/82

## 2018-06-28 DIAGNOSIS — L900 Lichen sclerosus et atrophicus: Secondary | ICD-10-CM

## 2018-06-28 NOTE — Progress Notes (Signed)
    TEKA CHANDA 27-Jul-1965 [id number]        53 y.o.  D6K3 presents for lichen sclerosus follow-up.  Had used Temovate intermittently but has not used it recently because she is not having any symptoms.  She notes that her periods are getting a little bit further out having stopped her birth control pills last year, but still regular.  Having some warm spells but no dramatic hot flashes or sweats.  Past medical history,surgical history, problem list, medications, allergies, family history and social history were all reviewed and documented in the EPIC chart.  Directed ROS with pertinent positives and negatives documented in the history of present illness/assessment and plan.  Exam: [name] assistant Vitals:   06/28/18 1026  BP: 130/82   General appearance:  Normal Abdomen soft nontender without masses guarding rebound Pelvic external BUS vagina with whitish skin changes from upper left to upper right inner labia minora.  Vagina otherwise normal.  Cervix normal.  Uterus grossly normal midline mobile nontender.  Adnexa without masses or tenderness.  Assessment/Plan:  53 y.o. G0P0 with stable exam consistent with lichen sclerosis.  Biopsy proven previously.  Patient asymptomatic at this time.  We will follow-up end of summer when due for her annual exam, sooner as needed.  We also discussed the perimenopause and what to expect.  As long as her menses remain fairly regular or less frequent but regular when they occur and she is not having significant menopausal symptoms we will monitor.  If she develops significant menopausal symptoms or significant irregular bleeding she will follow-up for evaluation.    Anastasio Auerbach MD, 10:39 AM 06/28/2018

## 2018-06-28 NOTE — Patient Instructions (Signed)
Follow-up end of summer for annual exam.  Sooner if any issues

## 2018-07-26 ENCOUNTER — Other Ambulatory Visit: Payer: Self-pay | Admitting: Gynecology

## 2018-07-26 DIAGNOSIS — Z1231 Encounter for screening mammogram for malignant neoplasm of breast: Secondary | ICD-10-CM

## 2018-09-27 DIAGNOSIS — J019 Acute sinusitis, unspecified: Secondary | ICD-10-CM | POA: Diagnosis not present

## 2018-10-11 ENCOUNTER — Other Ambulatory Visit: Payer: Self-pay

## 2018-10-11 ENCOUNTER — Ambulatory Visit
Admission: RE | Admit: 2018-10-11 | Discharge: 2018-10-11 | Disposition: A | Discharge status: Home / Self Care | Payer: 59 | Source: Ambulatory Visit | Attending: Gynecology | Admitting: Gynecology

## 2018-10-11 DIAGNOSIS — Z1231 Encounter for screening mammogram for malignant neoplasm of breast: Secondary | ICD-10-CM | POA: Diagnosis not present

## 2018-10-12 ENCOUNTER — Other Ambulatory Visit: Payer: Self-pay | Admitting: Gynecology

## 2018-10-12 DIAGNOSIS — R928 Other abnormal and inconclusive findings on diagnostic imaging of breast: Secondary | ICD-10-CM

## 2018-10-17 ENCOUNTER — Ambulatory Visit: Payer: 59

## 2018-10-17 ENCOUNTER — Other Ambulatory Visit: Payer: Self-pay

## 2018-10-17 ENCOUNTER — Ambulatory Visit
Admission: RE | Admit: 2018-10-17 | Discharge: 2018-10-17 | Disposition: A | Discharge status: Home / Self Care | Payer: 59 | Source: Ambulatory Visit | Attending: Gynecology | Admitting: Gynecology

## 2018-10-17 DIAGNOSIS — R928 Other abnormal and inconclusive findings on diagnostic imaging of breast: Secondary | ICD-10-CM

## 2018-12-25 ENCOUNTER — Other Ambulatory Visit: Payer: Self-pay

## 2018-12-26 ENCOUNTER — Encounter: Payer: Self-pay | Admitting: Gynecology

## 2018-12-26 ENCOUNTER — Ambulatory Visit (INDEPENDENT_AMBULATORY_CARE_PROVIDER_SITE_OTHER): Payer: 59 | Admitting: Gynecology

## 2018-12-26 VITALS — BP 120/78 | Ht 68.0 in | Wt 207.0 lb

## 2018-12-26 DIAGNOSIS — Z13220 Encounter for screening for lipoid disorders: Secondary | ICD-10-CM | POA: Diagnosis not present

## 2018-12-26 DIAGNOSIS — Z01419 Encounter for gynecological examination (general) (routine) without abnormal findings: Secondary | ICD-10-CM | POA: Diagnosis not present

## 2018-12-26 DIAGNOSIS — M8580 Other specified disorders of bone density and structure, unspecified site: Secondary | ICD-10-CM | POA: Diagnosis not present

## 2018-12-26 DIAGNOSIS — L900 Lichen sclerosus et atrophicus: Secondary | ICD-10-CM | POA: Diagnosis not present

## 2018-12-26 NOTE — Patient Instructions (Signed)
Follow-up in 1 year for annual exam, sooner as needed. 

## 2018-12-26 NOTE — Progress Notes (Signed)
    Eileen Donovan 1966/01/11 [id number]        53 y.o.  G0P0 for annual gynecologic exam.  Without gynecologic complaints.  Several issues noted below.  Past medical history,surgical history, problem list, medications, allergies, family history and social history were all reviewed and documented as reviewed in the EPIC chart.  ROS:  Performed with pertinent positives and negatives included in the history, assessment and plan.   Additional significant findings : None   Exam: [name] assistant Vitals:   12/26/18 0756  BP: 120/78  Weight: 207 lb (93.9 kg)  Height: 5\' 8"  (1.727 m)   Body mass index is 31.47 kg/m.  General appearance:  Normal affect, orientation and appearance. Skin: Grossly normal HEENT: Without gross lesions.  No cervical or supraclavicular adenopathy. Thyroid normal.  Lungs:  Clear without wheezing, rales or rhonchi Cardiac: RR, without RMG Abdominal:  Soft, nontender, without masses, guarding, rebound, organomegaly or hernia Breasts:  Examined lying and sitting without masses, retractions, discharge or axillary adenopathy. Pelvic:  Ext, BUS, Vagina: With mild atrophic changes.  White skin changes upper inner labia minora bilaterally consistent with history of lichen sclerosis  Cervix: Normal  Uterus: Anteverted, normal size, shape and contour, midline and mobile nontender   Adnexa: Without masses or tenderness    Anus and perineum: Normal   Rectovaginal: Normal sphincter tone without palpated masses or tenderness.    Assessment/Plan:  53 y.o. G0P0 female for annual gynecologic exam.   1. Perimenopausal.  Menses every 6 weeks or so.  No bleeding in between.  No significant hot flushes or sweats.  Not sexually active.  We discussed what to expect in the perimenopause.  She will keep track of her menses and call if they become significantly irregular or significant menopausal symptoms develop. 2. Lichen sclerosus upper inner labia minora.  Asymptomatic  without significant irritation.  Has prescription for clobetasol but does not use it.  Will call if needs more. 3. Osteopenia.  DEXA 2017 T score -1.3 FRAX 3.8% / 0.3%.  Prior 2012 T score -1.6.  We will plan repeat at 5-year interval.  Check vitamin D and TSH. 4. Mammography 10/2018.  Breast exam normal today.  Continue with annual mammography next year. 5. Colonoscopy 2020.  Repeat at their recommended interval. 6. Pap smear/HPV 2019.  No Pap smear done today.  No history of significant abnormal Pap smears.  Plan repeat Pap smear/HPV at 5-year interval per current screening guidelines. 7. Health maintenance.  Patient requests baseline labs as she missed her annual exam this year due to COVID.  CBC, CMP, lipid profile, TSH and vitamin D ordered.  Follow-up 1 year, sooner as needed.   Anastasio Auerbach MD, 8:19 AM 12/26/2018

## 2018-12-27 ENCOUNTER — Encounter: Payer: Self-pay | Admitting: Gynecology

## 2018-12-27 LAB — CBC WITH DIFFERENTIAL/PLATELET
Absolute Monocytes: 402 cells/uL (ref 200–950)
Basophils Absolute: 72 cells/uL (ref 0–200)
Basophils Relative: 1.2 %
Eosinophils Absolute: 270 cells/uL (ref 15–500)
Eosinophils Relative: 4.5 %
HCT: 41.3 % (ref 35.0–45.0)
Hemoglobin: 13.5 g/dL (ref 11.7–15.5)
Lymphs Abs: 924 cells/uL (ref 850–3900)
MCH: 29.3 pg (ref 27.0–33.0)
MCHC: 32.7 g/dL (ref 32.0–36.0)
MCV: 89.6 fL (ref 80.0–100.0)
MPV: 9.9 fL (ref 7.5–12.5)
Monocytes Relative: 6.7 %
Neutro Abs: 4332 cells/uL (ref 1500–7800)
Neutrophils Relative %: 72.2 %
Platelets: 338 10*3/uL (ref 140–400)
RBC: 4.61 10*6/uL (ref 3.80–5.10)
RDW: 12.4 % (ref 11.0–15.0)
Total Lymphocyte: 15.4 %
WBC: 6 10*3/uL (ref 3.8–10.8)

## 2018-12-27 LAB — VITAMIN D 25 HYDROXY (VIT D DEFICIENCY, FRACTURES): Vit D, 25-Hydroxy: 38 ng/mL (ref 30–100)

## 2018-12-27 LAB — LIPID PANEL
Cholesterol: 156 mg/dL (ref <200)
HDL: 70 mg/dL (ref >=50)
LDL Cholesterol (Calc): 71 mg/dL (calc)
Non-HDL Cholesterol (Calc): 86 mg/dL (calc) (ref <130)
Total CHOL/HDL Ratio: 2.2 (calc) (ref <5.0)
Triglycerides: 66 mg/dL (ref <150)

## 2018-12-27 LAB — COMPREHENSIVE METABOLIC PANEL
AG Ratio: 1.7 (calc) (ref 1.0–2.5)
ALT: 15 U/L (ref 6–29)
AST: 19 U/L (ref 10–35)
Albumin: 3.9 g/dL (ref 3.6–5.1)
Alkaline phosphatase (APISO): 72 U/L (ref 37–153)
BUN: 8 mg/dL (ref 7–25)
CO2: 25 mmol/L (ref 20–32)
Calcium: 9.5 mg/dL (ref 8.6–10.4)
Chloride: 105 mmol/L (ref 98–110)
Creat: 0.74 mg/dL (ref 0.50–1.05)
Globulin: 2.3 g/dL (calc) (ref 1.9–3.7)
Glucose, Bld: 103 mg/dL — ABNORMAL HIGH (ref 65–99)
Potassium: 4.3 mmol/L (ref 3.5–5.3)
Sodium: 137 mmol/L (ref 135–146)
Total Bilirubin: 0.5 mg/dL (ref 0.2–1.2)
Total Protein: 6.2 g/dL (ref 6.1–8.1)

## 2018-12-27 LAB — TSH: TSH: 3.76 mIU/L

## 2019-02-07 ENCOUNTER — Encounter: Payer: Self-pay | Admitting: Gynecology

## 2019-09-20 ENCOUNTER — Other Ambulatory Visit: Payer: Self-pay | Admitting: Obstetrics and Gynecology

## 2019-09-20 DIAGNOSIS — Z1231 Encounter for screening mammogram for malignant neoplasm of breast: Secondary | ICD-10-CM

## 2019-10-22 ENCOUNTER — Other Ambulatory Visit: Payer: Self-pay

## 2019-10-22 ENCOUNTER — Ambulatory Visit
Admission: RE | Admit: 2019-10-22 | Discharge: 2019-10-22 | Disposition: A | Discharge status: Home / Self Care | Payer: 59 | Source: Ambulatory Visit

## 2019-10-22 DIAGNOSIS — Z1231 Encounter for screening mammogram for malignant neoplasm of breast: Secondary | ICD-10-CM

## 2019-10-23 ENCOUNTER — Other Ambulatory Visit: Payer: Self-pay | Admitting: Obstetrics and Gynecology

## 2019-10-23 DIAGNOSIS — R928 Other abnormal and inconclusive findings on diagnostic imaging of breast: Secondary | ICD-10-CM

## 2019-10-23 NOTE — Progress Notes (Signed)
This patient needs further evaluation of her left breast for findings of a possible mass.

## 2019-11-01 ENCOUNTER — Other Ambulatory Visit: Payer: 59

## 2019-11-12 ENCOUNTER — Other Ambulatory Visit: Payer: Self-pay

## 2019-11-12 ENCOUNTER — Ambulatory Visit
Admission: RE | Admit: 2019-11-12 | Discharge: 2019-11-12 | Disposition: A | Discharge status: Home / Self Care | Payer: 59 | Source: Ambulatory Visit | Attending: Obstetrics and Gynecology | Admitting: Obstetrics and Gynecology

## 2019-11-12 DIAGNOSIS — R928 Other abnormal and inconclusive findings on diagnostic imaging of breast: Secondary | ICD-10-CM

## 2019-12-27 ENCOUNTER — Encounter: Payer: 59 | Admitting: Obstetrics and Gynecology

## 2019-12-31 ENCOUNTER — Encounter: Payer: Self-pay | Admitting: Obstetrics and Gynecology

## 2019-12-31 ENCOUNTER — Other Ambulatory Visit: Payer: Self-pay

## 2019-12-31 ENCOUNTER — Ambulatory Visit (INDEPENDENT_AMBULATORY_CARE_PROVIDER_SITE_OTHER): Payer: 59 | Admitting: Obstetrics and Gynecology

## 2019-12-31 VITALS — BP 114/70 | Ht 68.0 in | Wt 171.0 lb

## 2019-12-31 DIAGNOSIS — Z01419 Encounter for gynecological examination (general) (routine) without abnormal findings: Secondary | ICD-10-CM | POA: Diagnosis not present

## 2019-12-31 DIAGNOSIS — M8580 Other specified disorders of bone density and structure, unspecified site: Secondary | ICD-10-CM

## 2019-12-31 DIAGNOSIS — L900 Lichen sclerosus et atrophicus: Secondary | ICD-10-CM

## 2019-12-31 NOTE — Progress Notes (Signed)
Eileen Donovan 08/04/1965 [id number]  SUBJECTIVE:  54 y.o. G0P0 female here for annual routine gynecologic exam. She has no gynecologic concerns.  Current Outpatient Medications  Medication Sig Dispense Refill  . b complex vitamins tablet Take 1 tablet by mouth daily.    . Calcium Carbonate-Vitamin D (CALCIUM + D PO) Take by mouth.    . cetirizine (ZYRTEC) 10 MG tablet Take 10 mg by mouth daily.    . fluticasone (FLONASE) 50 MCG/ACT nasal spray Place 2 sprays into the nose daily. Prn    . montelukast (SINGULAIR) 10 MG tablet Take 10 mg by mouth at bedtime.    . Multiple Vitamin (MULTIVITAMIN) tablet Take 1 tablet by mouth daily.    Marland Kitchen PARoxetine (PAXIL) 30 MG tablet Take 30 mg by mouth daily.    . clobetasol cream (TEMOVATE) 0.05 % Apply to affected area nightly. (Patient not taking: Reported on 12/26/2018) 30 g 0   No current facility-administered medications for this visit.   Allergies: Penicillins and Sudafed [pseudoephedrine hcl]  Patient's last menstrual period was 09/09/2019.  Past medical history,surgical history, problem list, medications, allergies, family history and social history were all reviewed and documented as reviewed in the EPIC chart.  ROS:  Feeling well. No dyspnea or chest pain on exertion.  No abdominal pain, change in bowel habits, black or bloody stools.  No urinary tract symptoms. GYN ROS: no abnormal bleeding, pelvic pain or discharge, no breast pain or new or enlarging lumps on self exam.No neurological complaints.   OBJECTIVE:  BP 114/70   Ht 5\' 8"  (1.727 m)   Wt 171 lb (77.6 kg)   LMP 09/09/2019   BMI 26.00 kg/m  The patient appears well, alert, oriented x 3, in no distress. ENT normal.  Neck supple. No cervical or supraclavicular adenopathy or thyromegaly.  Lungs are clear, good air entry, no wheezes, rhonchi or rales. S1 and S2 normal, no murmurs, regular rate and rhythm.  Abdomen soft without tenderness, guarding, mass or organomegaly.    Neurological is normal, no focal findings.  BREAST EXAM: breasts appear normal, no suspicious masses, no skin or nipple changes or axillary nodes  PELVIC EXAM: VULVA: normal appearing vulva with mild atrophic change, no masses, tenderness or lesions, patch of blanching anteriorly on bilateral labia minora consistent with appearance of lichen sclerosus, VAGINA: normal appearing vagina with normal color and discharge, no lesions, CERVIX: normal appearing cervix without discharge or lesions, UTERUS: uterus is normal size, shape, consistency and nontender, ADNEXA: normal adnexa in size, nontender and no masses  Chaperone: [name] present during the examination  ASSESSMENT:  54 y.o. G0P0 here for annual gynecologic exam  PLAN:   1. Perimenopausal.  LMP 3 months ago, no irregular bleeding in between.  No significant hot flashes or night sweats.  Also taking paroxetine for other indications which might help with vasomotor symptoms.  She will notify us and come in for evaluation of any abnormally excessive menstrual bleeding patterns develop. 2. Pap smear/HPV 2019.  No significant history of abnormal Pap smears.  Next Pap smear due 2024 following the current guidelines recommending the 5 year interval. 3. Lichen sclerosus.  Stable and asymptomatic.  Not need of any treatment at this time. 4. Mammogram 10/2018.  Did have a call back for diagnostic mammogram and ultrasound on left breast which turned out to be normal.  Normal breast exam today.  Will continue with annual mammograms. 5. Colonoscopy 2020.  Recommended that she follow up at the recommended  interval.  6. Osteopenia.  DEXA 2017 T score -1.3 FRAX 3.8% / 0.3%.  Prior 2012 T score -1.6.    Per previous Dr. Loetta Rough note we will plan repeat at 5-year interval.    She is supplementing with vitamin D and calcium and staying active with regular physical activity. 7. Health maintenance.  No labs today as she reports she recently had these completed with  her primary care doctor.  Return annually or sooner, prn.  Joseph Pierini MD 12/31/19

## 2020-07-14 DIAGNOSIS — F324 Major depressive disorder, single episode, in partial remission: Secondary | ICD-10-CM | POA: Diagnosis not present

## 2020-07-22 DIAGNOSIS — H04123 Dry eye syndrome of bilateral lacrimal glands: Secondary | ICD-10-CM | POA: Diagnosis not present

## 2020-07-22 DIAGNOSIS — H43812 Vitreous degeneration, left eye: Secondary | ICD-10-CM | POA: Diagnosis not present

## 2020-07-22 DIAGNOSIS — H2513 Age-related nuclear cataract, bilateral: Secondary | ICD-10-CM | POA: Diagnosis not present

## 2020-10-03 DIAGNOSIS — Z0000 Encounter for general adult medical examination without abnormal findings: Secondary | ICD-10-CM | POA: Diagnosis not present

## 2020-10-03 DIAGNOSIS — E559 Vitamin D deficiency, unspecified: Secondary | ICD-10-CM | POA: Diagnosis not present

## 2020-10-03 DIAGNOSIS — Z13220 Encounter for screening for lipoid disorders: Secondary | ICD-10-CM | POA: Diagnosis not present

## 2020-10-03 DIAGNOSIS — F324 Major depressive disorder, single episode, in partial remission: Secondary | ICD-10-CM | POA: Diagnosis not present

## 2020-10-07 ENCOUNTER — Other Ambulatory Visit: Payer: Self-pay | Admitting: Obstetrics and Gynecology

## 2020-10-07 DIAGNOSIS — Z1231 Encounter for screening mammogram for malignant neoplasm of breast: Secondary | ICD-10-CM

## 2020-12-08 ENCOUNTER — Other Ambulatory Visit: Payer: Self-pay

## 2020-12-08 ENCOUNTER — Ambulatory Visit
Admission: RE | Admit: 2020-12-08 | Discharge: 2020-12-08 | Disposition: A | Discharge status: Home / Self Care | Payer: BC Managed Care – PPO | Source: Ambulatory Visit

## 2020-12-08 DIAGNOSIS — Z1231 Encounter for screening mammogram for malignant neoplasm of breast: Secondary | ICD-10-CM

## 2020-12-23 DIAGNOSIS — M25512 Pain in left shoulder: Secondary | ICD-10-CM | POA: Diagnosis not present

## 2021-01-01 ENCOUNTER — Encounter: Payer: 59 | Admitting: Obstetrics and Gynecology

## 2021-01-08 ENCOUNTER — Ambulatory Visit: Payer: Self-pay | Admitting: Obstetrics and Gynecology

## 2021-01-12 ENCOUNTER — Encounter: Payer: Self-pay | Admitting: Obstetrics and Gynecology

## 2021-01-12 ENCOUNTER — Ambulatory Visit (INDEPENDENT_AMBULATORY_CARE_PROVIDER_SITE_OTHER): Payer: BC Managed Care – PPO | Admitting: Obstetrics and Gynecology

## 2021-01-12 ENCOUNTER — Other Ambulatory Visit: Payer: Self-pay

## 2021-01-12 VITALS — BP 128/64 | HR 63 | Ht 67.25 in | Wt 188.0 lb

## 2021-01-12 DIAGNOSIS — Z01419 Encounter for gynecological examination (general) (routine) without abnormal findings: Secondary | ICD-10-CM

## 2021-01-12 DIAGNOSIS — N951 Menopausal and female climacteric states: Secondary | ICD-10-CM

## 2021-01-12 DIAGNOSIS — N9089 Other specified noninflammatory disorders of vulva and perineum: Secondary | ICD-10-CM

## 2021-01-12 NOTE — Patient Instructions (Signed)

## 2021-01-12 NOTE — Progress Notes (Signed)
55 y.o. 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    Has suicidal tendencies occasionally and sees PCP. Had an episode 06/2020. She feels the Paxil is working well.   LNMP 08/2019 then has slight bleeding 08/2020. Patient complaining of occasional hot flashes. Hot flashes are not too bad.   Not treating lichen sclerosus.  Here in [place] for 29 years.  From [place] originally.  She is a Social research officer, government.   PCP: Kelton Pillar, MD.  Will see Dr. Esmeralda Links now.  Patient's last menstrual period was 08/15/2020.     Period Pattern: (!) Irregular     Sexually active: No.   She considers herself to be asexual.  The current method of family planning is none.    Exercising: Yes.     Weights, cardio, running, swimming, biking, yoga Smoker:  no  Health Maintenance: Pap: 12-21-17 Neg:Neg HR HPV, 11-22-13 Neg:Neg HR HPV, 08-26-10 Neg History of abnormal Pap:  no MMG: 12-08-20 3D/Neg/BiRads1 Colonoscopy:  3 years ago--hx of polyps in past;next 5 years BMD: 01-20-16  Result :Osteopenia TDaP:  PCP Gardasil:   no HIV:no Hep C:no Screening Labs:  PCP.   reports that she has never smoked. She has never used smokeless tobacco. She reports that she does not currently use alcohol. She reports that she does not use drugs.  Past Medical History:  Diagnosis Date   Colon polyps 02/2010   benign   Depression    Environmental allergies    History of suicidal tendencies    Osteopenia 01/2016   T score -1.3 FRAX 3.8%/0.3%   Vitiligo     Past Surgical History:  Procedure Laterality Date   APPENDECTOMY     MOUTH SURGERY      Current Outpatient Medications  Medication Sig Dispense Refill   b complex vitamins tablet Take 1 tablet by mouth daily.     Calcium Carbonate-Vitamin D (CALCIUM + D PO) Take by mouth.     cetirizine (ZYRTEC) 10 MG tablet Take 10 mg by mouth daily.     fluticasone (FLONASE) 50 MCG/ACT nasal spray Place 2 sprays into the nose daily. Prn     montelukast (SINGULAIR) 10  MG tablet Take 10 mg by mouth at bedtime.     Multiple Vitamin (MULTIVITAMIN) tablet Take 1 tablet by mouth daily.     PARoxetine (PAXIL) 40 MG tablet Take 40 mg by mouth every morning.     No current facility-administered medications for this visit.    Family History  Problem Relation Age of Onset   Hyperlipidemia Mother    Heart disease Mother    Heart attack Mother    Stroke Father     Review of Systems  All other systems reviewed and are negative.  Exam:   BP 128/64   Pulse 63   Ht 5' 7.25" (1.708 m)   Wt 188 lb (85.3 kg)   LMP 08/15/2020   SpO2 98%   BMI 29.23 kg/m     General appearance: alert, cooperative and appears stated age Head: normocephalic, without obvious abnormality, atraumatic Neck: no adenopathy, supple, symmetrical, trachea midline and thyroid normal to inspection and palpation Lungs: clear to auscultation bilaterally Breasts: normal appearance, no masses or tenderness, No nipple retraction or dimpling, No nipple discharge or bleeding, No axillary adenopathy Heart: regular rate and rhythm Abdomen: soft, non-tender; no masses, no organomegaly Extremities: extremities normal, atraumatic, no cyanosis or edema Skin: hypopigmentation noted. Texture, turgor normal. No rashes or lesions Lymph nodes: cervical, supraclavicular, and axillary  nodes normal. Neurologic: grossly normal  Pelvic: External genitalia:  5 - 6 mm thickened lesion to the right of the urethra.               No abnormal inguinal nodes palpated.              Urethra:  normal appearing urethra with no masses, tenderness or lesions              Bartholins and Skenes: normal                 Vagina: normal appearing vagina with normal color and discharge, no lesions              Cervix: no lesions              Pap taken: no Bimanual Exam:  Uterus:  normal size, contour, position, consistency, mobility, non-tender              Adnexa: no mass, fullness, tenderness              Rectal exam: yes.   Confirms.              Anus:  normal sphincter tone, no lesions  Chaperone was present for exam:  Sharee Pimple, RN  Assessment:   Well woman visit with gynecologic exam. Perimenopausal female. Vulva lesion. Lichen sclerosus.  Vitiligo. Osteopenia.   Plan: Mammogram screening discussed. Self breast awareness reviewed. Pap and HR HPV 2024. Guidelines for Calcium, Vitamin D, regular exercise program including cardiovascular and weight bearing exercise. Check FSH and estradiol. Return for vulvar biopsy to rule out dysplasia.  Patient is in agreement with this plan. BMD next year.  Follow up annually and prn.   After visit summary provided.

## 2021-01-13 LAB — FOLLICLE STIMULATING HORMONE: FSH: 85.4 m[IU]/mL

## 2021-01-13 LAB — ESTRADIOL: Estradiol: 23 pg/mL

## 2021-01-20 NOTE — Progress Notes (Signed)
GYNECOLOGY  VISIT   HPI: 55 y.o.   Single  [place]  female   G0P0 with No LMP recorded. Patient is perimenopausal.   here for vulvar biopsy.    GYNECOLOGIC HISTORY: No LMP recorded. Patient is perimenopausal. Contraception: None Menopausal hormone therapy:  none Last mammogram: 12-08-20 3D/Neg/BiRads1 Last pap smear:   12-21-17 Neg:Neg HR HPV, 11-22-13 Neg:Neg HR HPV, 08-26-10 Neg        OB History     Gravida  0   Para      Term      Preterm      AB      Living         SAB      IAB      Ectopic      Multiple      Live Births                 There are no problems to display for this patient.   Past Medical History:  Diagnosis Date   Colon polyps 02/2010   benign   Depression    Environmental allergies    History of suicidal tendencies    Osteopenia 01/2016   T score -1.3 FRAX 3.8%/0.3%   Vitiligo     Past Surgical History:  Procedure Laterality Date   APPENDECTOMY     MOUTH SURGERY      Current Outpatient Medications  Medication Sig Dispense Refill   b complex vitamins tablet Take 1 tablet by mouth daily.     Calcium Carbonate-Vitamin D (CALCIUM + D PO) Take by mouth.     cetirizine (ZYRTEC) 10 MG tablet Take 10 mg by mouth daily.     cyclobenzaprine (FLEXERIL) 5 MG tablet Take 5 mg by mouth at bedtime as needed.     fluticasone (FLONASE) 50 MCG/ACT nasal spray Place 2 sprays into the nose daily. Prn     montelukast (SINGULAIR) 10 MG tablet Take 10 mg by mouth at bedtime.     Multiple Vitamin (MULTIVITAMIN) tablet Take 1 tablet by mouth daily.     PARoxetine (PAXIL) 40 MG tablet Take 40 mg by mouth every morning.     No current facility-administered medications for this visit.     ALLERGIES: Penicillins and Sudafed [pseudoephedrine hcl]  Family History  Problem Relation Age of Onset   Hyperlipidemia Mother    Heart disease Mother    Heart attack Mother    Stroke Father     Social History   Socioeconomic History   Marital status:  Single    Spouse name: Not on file   Number of children: Not on file   Years of education: Not on file   Highest education level: Not on file  Occupational History   Not on file  Tobacco Use   Smoking status: Never   Smokeless tobacco: Never  Vaping Use   Vaping Use: Never used  Substance and Sexual Activity   Alcohol use: Not Currently   Drug use: No   Sexual activity: Not Currently    Comment: 1st intercourse 55 yo-Fewer than 5 partners  Other Topics Concern   Not on file  Social History Narrative   Not on file   Social Determinants of Health   Financial Resource Strain: Not on file  Food Insecurity: Not on file  Transportation Needs: Not on file  Physical Activity: Not on file  Stress: Not on file  Social Connections: Not on file  Intimate Partner Violence: Not  on file    Review of Systems  All other systems reviewed and are negative.  PHYSICAL EXAMINATION:    BP 122/70   Pulse 72   Ht 5' 7.25" (1.708 m)   Wt 188 lb (85.3 kg)   SpO2 98%   BMI 29.23 kg/m     General appearance: alert, cooperative and appears stated age   Pelvic: External genitalia:  right medial superior labia minora with 7 - 8 mm area of thickened white epithelium               Urethra:  normal appearing urethra with no masses, tenderness or lesions           Vulvar biopsy  Consent for procedure.  Sterile prep with betadine.  Local 1% lidocaine.  4 mm punch biopsy done.  Tissue to pathology.  Single suture of 3/0 Vicryl.  No complications.  Minimal EBL.  Chaperone was present for exam:  Estill Bamberg, CMA.  ASSESSMENT  Right medial labia minora lesion. History of lichen sclerosus.  PLAN  Fu biopsy.  Final plan to follow.   An After Visit Summary was printed and given to the patient.

## 2021-01-21 ENCOUNTER — Other Ambulatory Visit (HOSPITAL_COMMUNITY)
Admission: RE | Admit: 2021-01-21 | Discharge: 2021-01-21 | Disposition: A | Discharge status: Home / Self Care | Payer: BC Managed Care – PPO | Source: Ambulatory Visit | Attending: Obstetrics and Gynecology | Admitting: Obstetrics and Gynecology

## 2021-01-21 ENCOUNTER — Other Ambulatory Visit: Payer: Self-pay

## 2021-01-21 ENCOUNTER — Encounter: Payer: Self-pay | Admitting: Obstetrics and Gynecology

## 2021-01-21 ENCOUNTER — Ambulatory Visit (INDEPENDENT_AMBULATORY_CARE_PROVIDER_SITE_OTHER): Payer: BC Managed Care – PPO | Admitting: Obstetrics and Gynecology

## 2021-01-21 DIAGNOSIS — L900 Lichen sclerosus et atrophicus: Secondary | ICD-10-CM

## 2021-01-21 DIAGNOSIS — N9089 Other specified noninflammatory disorders of vulva and perineum: Secondary | ICD-10-CM | POA: Diagnosis not present

## 2021-01-21 DIAGNOSIS — N904 Leukoplakia of vulva: Secondary | ICD-10-CM | POA: Diagnosis not present

## 2021-01-21 IMAGING — US US BREAST*L* LIMITED INC AXILLA
1 series · 4 of 4 positions shown · non-contrast
Comparison: Previous exam(s).

CLINICAL DATA: Patient recalled from screening for left breast
mass.

EXAM:
DIGITAL DIAGNOSTIC LEFT MAMMOGRAM WITH CAD AND TOMO
ULTRASOUND LEFT BREAST

[Series 1: us breast*left* limited inc axilla · 0.06mm/px · 4 of 4 slices shown]
[im 1/4]
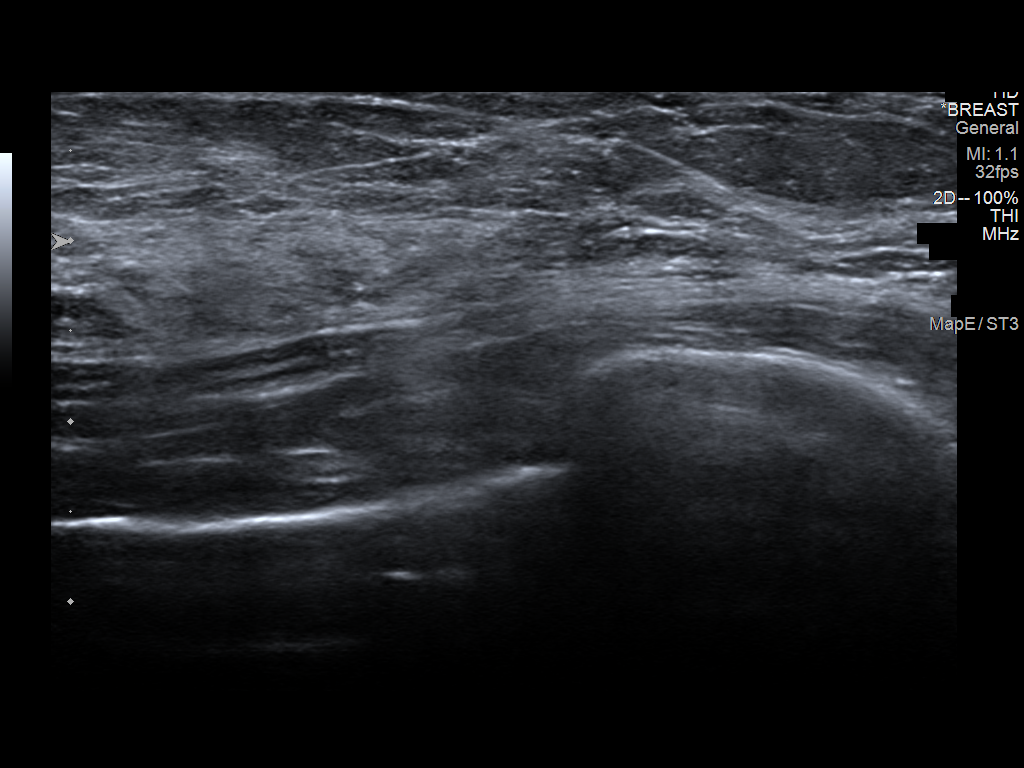
[im 2/4]
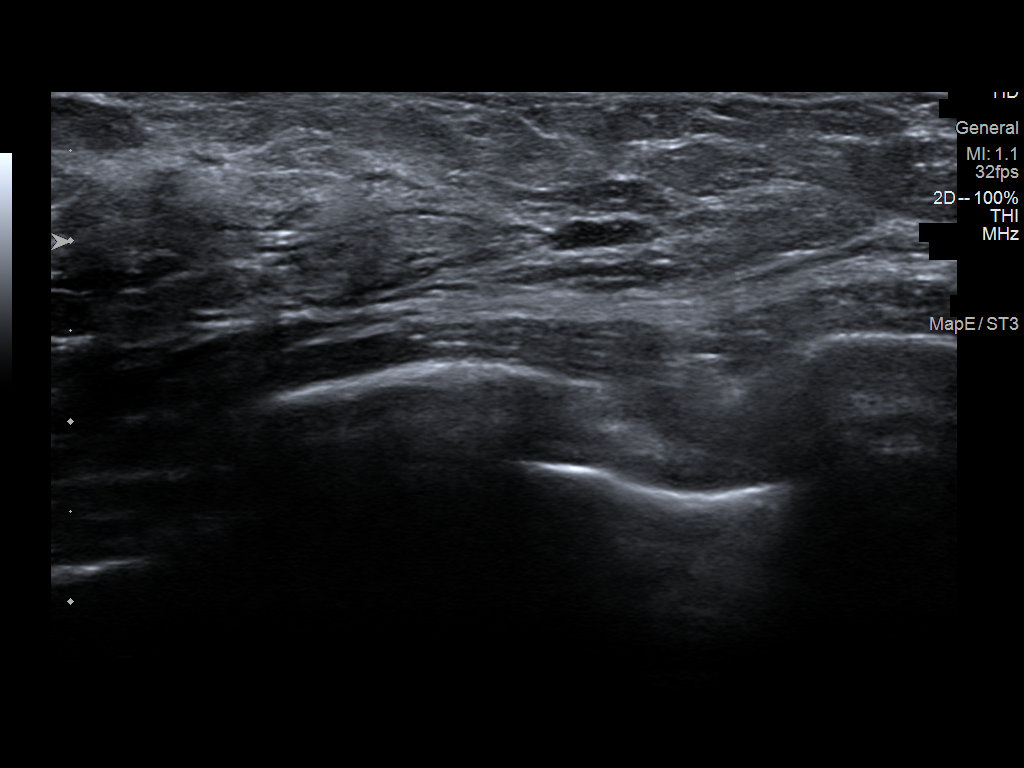
[im 3/4]
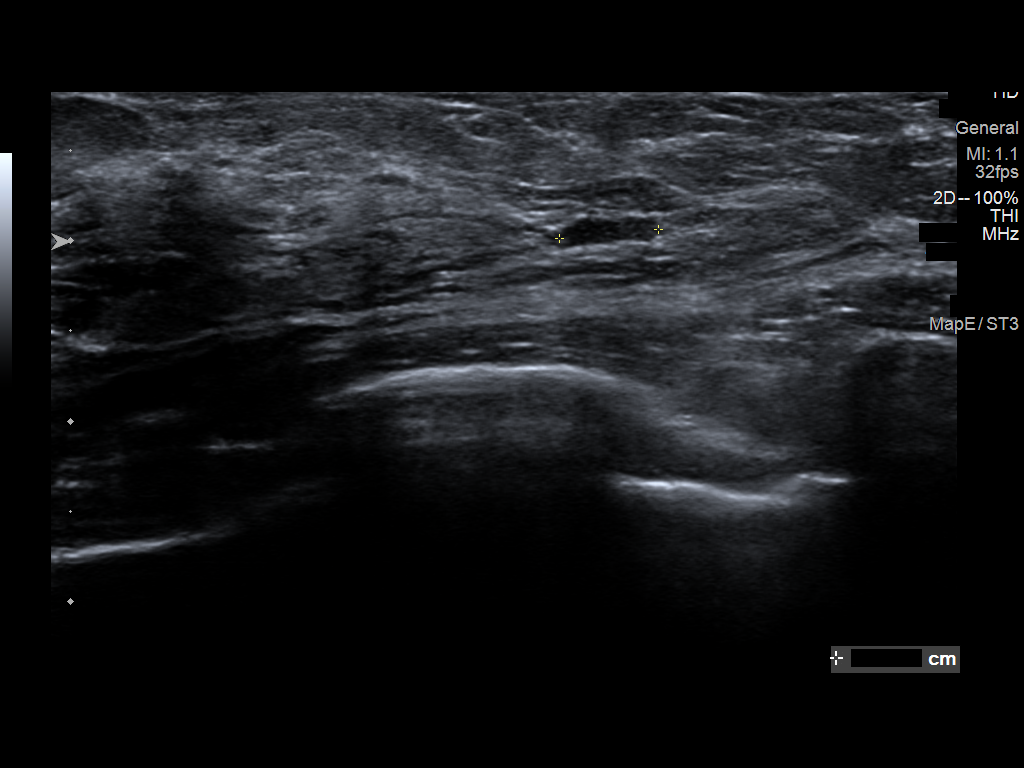
[im 4/4]
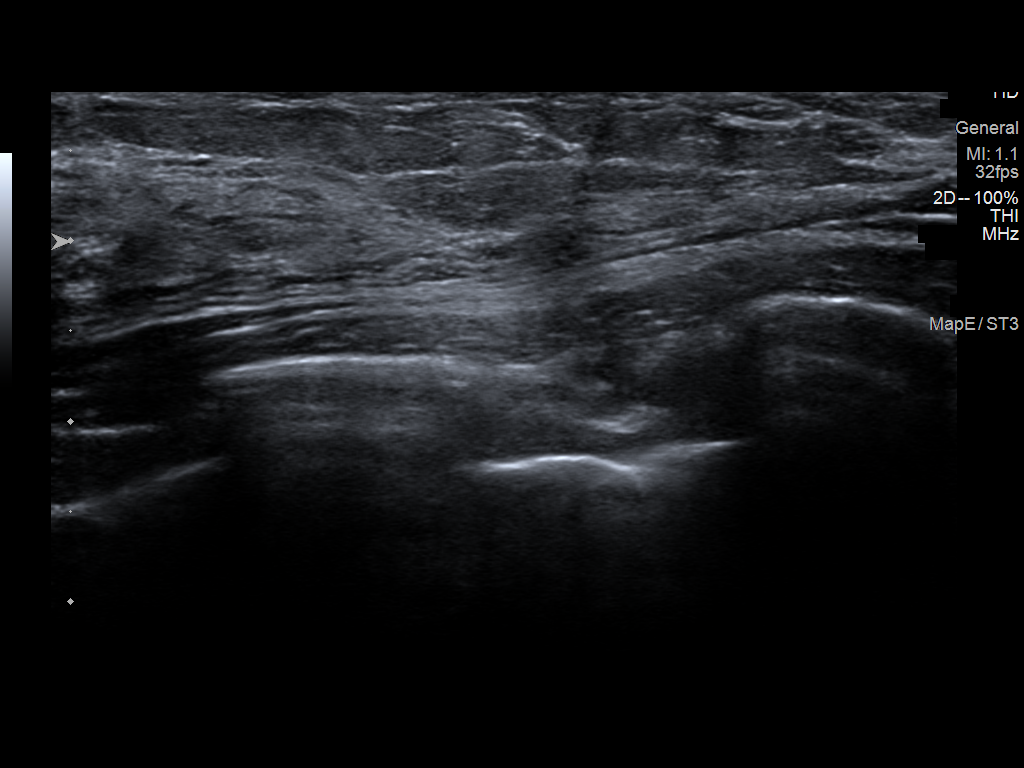

[4 of 4 positions shown; findings below may reference images not displayed]

ACR Breast Density Category b: There are scattered areas of
fibroglandular density.
FINDINGS: Questioned mass within the outer left breast predominately effaced
additional imaging suggestive of dense fibroglandular tissue. No
suspicious findings on additional imaging.

Mammographic images were processed with CAD.

On physical exam, no discrete mass is palpated within outer left
breast.

Targeted ultrasound is performed, showing normal tissue without
suspicious mass within the outer left breast.
IMPRESSION: No mammographic evidence for malignancy.

RECOMMENDATION:
Screening mammogram in one year.(Code:L2-Y-IIE)

I have discussed the findings and recommendations with the patient.
If applicable, a reminder letter will be sent to the patient
regarding the next appointment.

BI-RADS CATEGORY  2: Benign.

## 2021-01-21 NOTE — Patient Instructions (Signed)
Vulva Biopsy, Care After This sheet gives you information about how to care for yourself after your procedure. Your health care provider may also give you more specific instructions. If you have problems or questions, contact your health care provider. What can I expect after the procedure? After the procedure, it is common to have: Slight bleeding from the biopsy site. Discomfort at the biopsy site. Follow these instructions at home: Biopsy site care  Follow instructions from your health care provider about how to take care of your biopsy site. Make sure you: Clean the area using water and mild soap twice a day or as told by your health care provider. Gently pat the area dry. If you were prescribed an antibiotic ointment, apply it as told by your health care provider. Do not stop using the antibiotic even if your condition improves. Take a warm water bath (sitz bath) as needed to help with pain and discomfort. A sitz bath is taken while you are sitting down. The water should only come up to your hips and should cover your buttocks. Leave stitches (sutures), skin glue, or adhesive strips in place. These skin closures may need to stay in place for 2 weeks or longer. If adhesive strip edges start to loosen and curl up, you may trim the loose edges. Do not remove adhesive strips completely unless your health care provider tells you to do that. Check your biopsy site every day for signs of infection. Check for: More redness, swelling, or pain. More fluid or blood. Warmth. Pus or a bad smell. Do not rub the biopsy area after urinating. Gently pat the area dry or use a bottle filled with warm water (peri-bottle) to clean the area. Gently wipe from front to back. Lifestyle Wear loose, cotton underwear. Do not wear tight pants. Do not use a tampon, douche, or put anything inside your vagina for at least 1 week or until your health care provider approves. Do not have sex for at least 1 week or until  your health care provider approves. Do not exercise, such as running or biking, until your health care provider approves. Do not swim or use a hot tub until your health care provider approves. You may shower or take a sitz bath. General instructions Take over-the-counter and prescription medicines only as told by your health care provider. Use a sanitary napkin until the bleeding stops. Keep all follow-up visits as told by your health care provider. This is important. Contact a health care provider if: You have more redness, swelling, or pain around your biopsy site. You have more fluid or blood coming from your biopsy site. Your biopsy site feels warm to the touch. Your pain is not controlled with medicine. Get help right away if you have: Heavy bleeding from the vulva. Pus or a bad smell coming from your biopsy site. A fever. Lower abdominal pain. Summary After the procedure, it is common to have slight bleeding and discomfort at the biopsy site. Follow instructions from your health care provider after your biopsy. Make sure you clean the area with water and mild soap. Pat the area dry. Take sitz baths as needed to help with pain and discomfort. Leave any sutures in place. Check your biopsy site for signs of infection, which may include more redness, swelling, pain, fluid, or blood, or feeling warm to the touch. Get help right away if you have heavy bleeding, a fever, pus or a bad smell, or pain in the lower abdomen. This information is  not intended to replace advice given to you by your health care provider. Make sure you discuss any questions you have with your health care provider. Document Revised: 11/03/2017 Document Reviewed: 11/03/2017 Elsevier Patient Education  2022 [employer].

## 2021-01-23 LAB — SURGICAL PATHOLOGY

## 2021-01-26 ENCOUNTER — Other Ambulatory Visit: Payer: Self-pay | Admitting: *Deleted

## 2021-01-26 MED ORDER — CLOBETASOL PROPIONATE 0.05 % EX OINT: 1.0000 "application " | TOPICAL_OINTMENT | Freq: Two times a day (BID) | CUTANEOUS | 0 refills | Status: DC

## 2021-06-17 ENCOUNTER — Other Ambulatory Visit: Payer: Self-pay

## 2021-06-17 DIAGNOSIS — M8580 Other specified disorders of bone density and structure, unspecified site: Secondary | ICD-10-CM

## 2021-08-10 ENCOUNTER — Other Ambulatory Visit: Payer: Self-pay | Admitting: Obstetrics and Gynecology

## 2021-08-10 ENCOUNTER — Other Ambulatory Visit: Payer: Self-pay

## 2021-08-10 ENCOUNTER — Ambulatory Visit (INDEPENDENT_AMBULATORY_CARE_PROVIDER_SITE_OTHER): Payer: No Typology Code available for payment source

## 2021-08-10 DIAGNOSIS — Z780 Asymptomatic menopausal state: Secondary | ICD-10-CM

## 2021-08-10 DIAGNOSIS — M8589 Other specified disorders of bone density and structure, multiple sites: Secondary | ICD-10-CM

## 2021-08-10 DIAGNOSIS — M8580 Other specified disorders of bone density and structure, unspecified site: Secondary | ICD-10-CM

## 2021-11-05 ENCOUNTER — Other Ambulatory Visit: Payer: Self-pay | Admitting: Obstetrics and Gynecology

## 2021-11-05 DIAGNOSIS — Z1231 Encounter for screening mammogram for malignant neoplasm of breast: Secondary | ICD-10-CM

## 2021-11-11 DIAGNOSIS — Z1231 Encounter for screening mammogram for malignant neoplasm of breast: Secondary | ICD-10-CM

## 2021-12-09 ENCOUNTER — Ambulatory Visit
Admission: RE | Admit: 2021-12-09 | Discharge: 2021-12-09 | Disposition: A | Discharge status: Home / Self Care | Payer: Self-pay | Source: Ambulatory Visit

## 2021-12-09 DIAGNOSIS — Z1231 Encounter for screening mammogram for malignant neoplasm of breast: Secondary | ICD-10-CM

## 2022-01-07 ENCOUNTER — Other Ambulatory Visit: Payer: Self-pay | Admitting: Sports Medicine

## 2022-01-07 DIAGNOSIS — M25522 Pain in left elbow: Secondary | ICD-10-CM

## 2022-01-20 ENCOUNTER — Ambulatory Visit
Admission: RE | Admit: 2022-01-20 | Discharge: 2022-01-20 | Disposition: A | Discharge status: Home / Self Care | Payer: Self-pay | Source: Ambulatory Visit | Attending: Sports Medicine | Admitting: Sports Medicine

## 2022-01-20 DIAGNOSIS — M25522 Pain in left elbow: Secondary | ICD-10-CM

## 2022-02-01 NOTE — Progress Notes (Unsigned)
56 y.o. 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    PCP:     Patient's last menstrual period was 08/15/2020.           Sexually active: {yes no:314532}  The current method of family planning is {contraception:315051}.    Exercising: {yes no:314532}  {types:19826} Smoker:  no  Health Maintenance: Pap:   12-21-17 Neg:Neg HR HPV, 11-22-13 Neg:Neg HR HPV, 08-26-10 Neg History of abnormal Pap:  no MMG:  12-09-21 Neg/BiRads1 Colonoscopy:  2019;next 5 years BMD:  08-11-10  Result :Osteopenia TDaP:  PCP Gardasil:   no HIV:no Hep C:no Screening Labs:  Hb today: ***, Urine today: ***   reports that she has never smoked. She has never used smokeless tobacco. She reports that she does not currently use alcohol. She reports that she does not use drugs.  Past Medical History:  Diagnosis Date   Colon polyps 02/2010   benign   Depression    Environmental allergies    History of suicidal tendencies    Osteopenia 01/2016   T score -1.3 FRAX 3.8%/0.3%   Vitiligo     Past Surgical History:  Procedure Laterality Date   APPENDECTOMY     MOUTH SURGERY      Current Outpatient Medications  Medication Sig Dispense Refill   b complex vitamins tablet Take 1 tablet by mouth daily.     Calcium Carbonate-Vitamin D (CALCIUM + D PO) Take by mouth.     cetirizine (ZYRTEC) 10 MG tablet Take 10 mg by mouth daily.     clobetasol ointment (TEMOVATE) 2.69 % Apply 1 application topically 2 (two) times daily. apply in a thin layer bid x 2 weeks at a time as needed 30 g 0   cyclobenzaprine (FLEXERIL) 5 MG tablet Take 5 mg by mouth at bedtime as needed.     fluticasone (FLONASE) 50 MCG/ACT nasal spray Place 2 sprays into the nose daily. Prn     montelukast (SINGULAIR) 10 MG tablet Take 10 mg by mouth at bedtime.     Multiple Vitamin (MULTIVITAMIN) tablet Take 1 tablet by mouth daily.     PARoxetine (PAXIL) 40 MG tablet Take 40 mg by mouth every morning.     No current facility-administered  medications for this visit.    Family History  Problem Relation Age of Onset   Hyperlipidemia Mother    Heart disease Mother    Heart attack Mother    Stroke Father     Review of Systems  Exam:   LMP 08/15/2020     General appearance: alert, cooperative and appears stated age Head: normocephalic, without obvious abnormality, atraumatic Neck: no adenopathy, supple, symmetrical, trachea midline and thyroid normal to inspection and palpation Lungs: clear to auscultation bilaterally Breasts: normal appearance, no masses or tenderness, No nipple retraction or dimpling, No nipple discharge or bleeding, No axillary adenopathy Heart: regular rate and rhythm Abdomen: soft, non-tender; no masses, no organomegaly Extremities: extremities normal, atraumatic, no cyanosis or edema Skin: skin color, texture, turgor normal. No rashes or lesions Lymph nodes: cervical, supraclavicular, and axillary nodes normal. Neurologic: grossly normal  Pelvic: External genitalia:  no lesions              No abnormal inguinal nodes palpated.              Urethra:  normal appearing urethra with no masses, tenderness or lesions              Bartholins and Skenes: normal  Vagina: normal appearing vagina with normal color and discharge, no lesions              Cervix: no lesions              Pap taken: {yes no:314532} Bimanual Exam:  Uterus:  normal size, contour, position, consistency, mobility, non-tender              Adnexa: no mass, fullness, tenderness              Rectal exam: {yes no:314532}.  Confirms.              Anus:  normal sphincter tone, no lesions  Chaperone was present for exam:  ***  Assessment:   Well woman visit with gynecologic exam.   Plan: Mammogram screening discussed. Self breast awareness reviewed. Pap and HR HPV as above. Guidelines for Calcium, Vitamin D, regular exercise program including cardiovascular and weight bearing exercise.   Follow up annually and  prn.   Additional counseling given.  {yes [id number]. _______ minutes face to face time of which over 50% was spent in counseling.    After visit summary provided.

## 2022-02-02 ENCOUNTER — Encounter: Payer: Self-pay | Admitting: Obstetrics and Gynecology

## 2022-02-02 ENCOUNTER — Other Ambulatory Visit (HOSPITAL_COMMUNITY)
Admission: RE | Admit: 2022-02-02 | Discharge: 2022-02-02 | Disposition: A | Discharge status: Home / Self Care | Payer: Self-pay | Source: Ambulatory Visit | Attending: Obstetrics and Gynecology | Admitting: Obstetrics and Gynecology

## 2022-02-02 ENCOUNTER — Ambulatory Visit (INDEPENDENT_AMBULATORY_CARE_PROVIDER_SITE_OTHER): Payer: No Typology Code available for payment source | Admitting: Obstetrics and Gynecology

## 2022-02-02 VITALS — BP 108/62 | HR 82 | Ht 67.5 in | Wt 185.0 lb

## 2022-02-02 DIAGNOSIS — Z01419 Encounter for gynecological examination (general) (routine) without abnormal findings: Secondary | ICD-10-CM

## 2022-02-02 DIAGNOSIS — N950 Postmenopausal bleeding: Secondary | ICD-10-CM | POA: Diagnosis not present

## 2022-02-02 DIAGNOSIS — Z124 Encounter for screening for malignant neoplasm of cervix: Secondary | ICD-10-CM | POA: Insufficient documentation

## 2022-02-02 NOTE — Patient Instructions (Signed)

## 2022-02-03 LAB — CYTOLOGY - PAP
Comment: NEGATIVE
Diagnosis: NEGATIVE
High risk HPV: NEGATIVE

## 2022-02-17 IMAGING — MG MM DIGITAL SCREENING BILAT W/ TOMO AND CAD
8 series · 9 of 24 positions shown · non-contrast
Comparison: Previous exam(s).

CLINICAL DATA: Screening.

EXAM:
DIGITAL SCREENING BILATERAL MAMMOGRAM WITH TOMOSYNTHESIS AND CAD
TECHNIQUE: Bilateral screening digital craniocaudal and mediolateral oblique
mammograms were obtained. Bilateral screening digital breast
tomosynthesis was performed. The images were evaluated with
computer-aided detection.

[R MLO synth-2D]
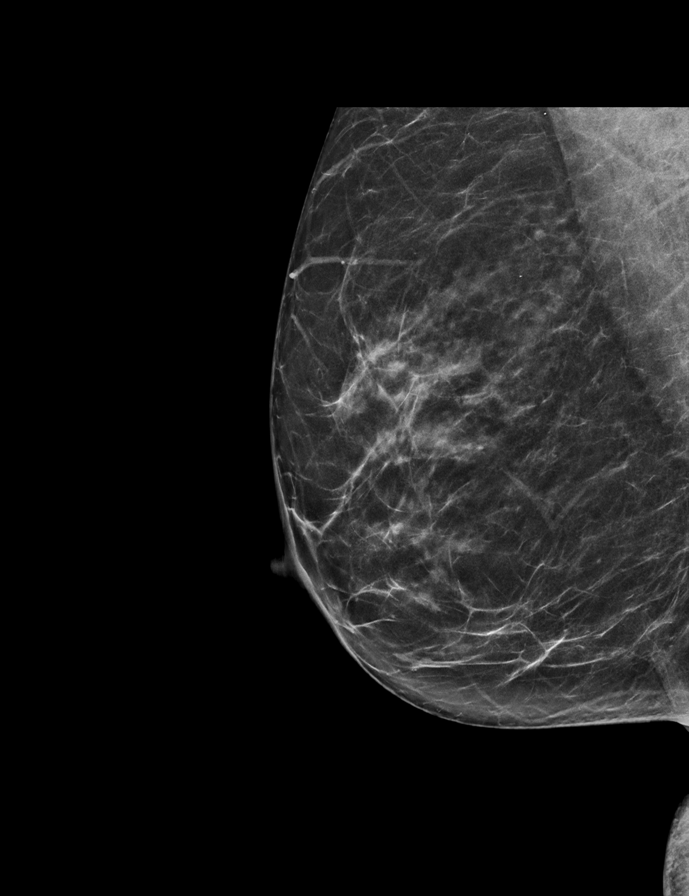

[L MLO synth-2D]
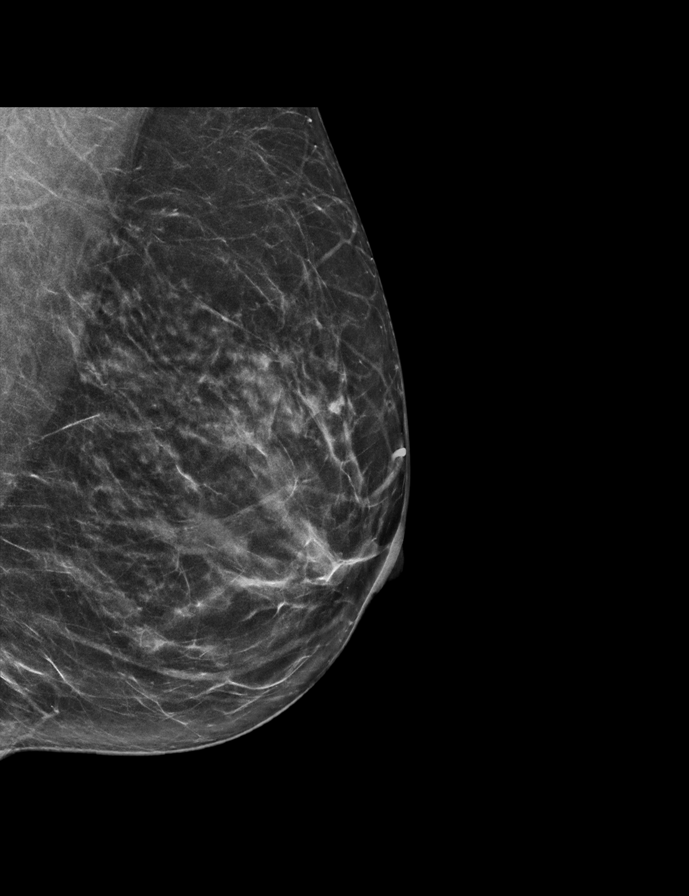

[L CC synth-2D]
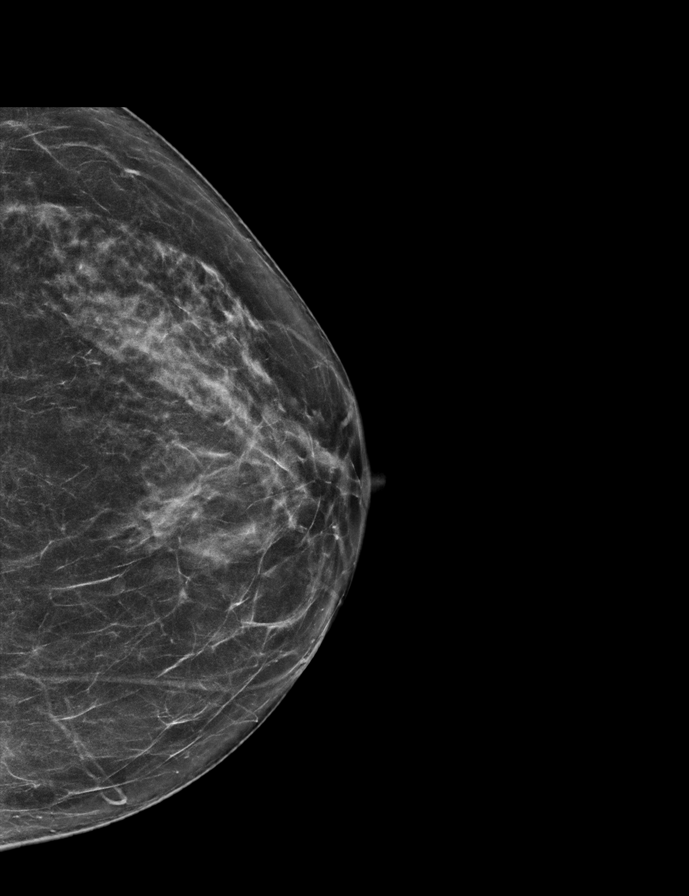

[R CC synth-2D]
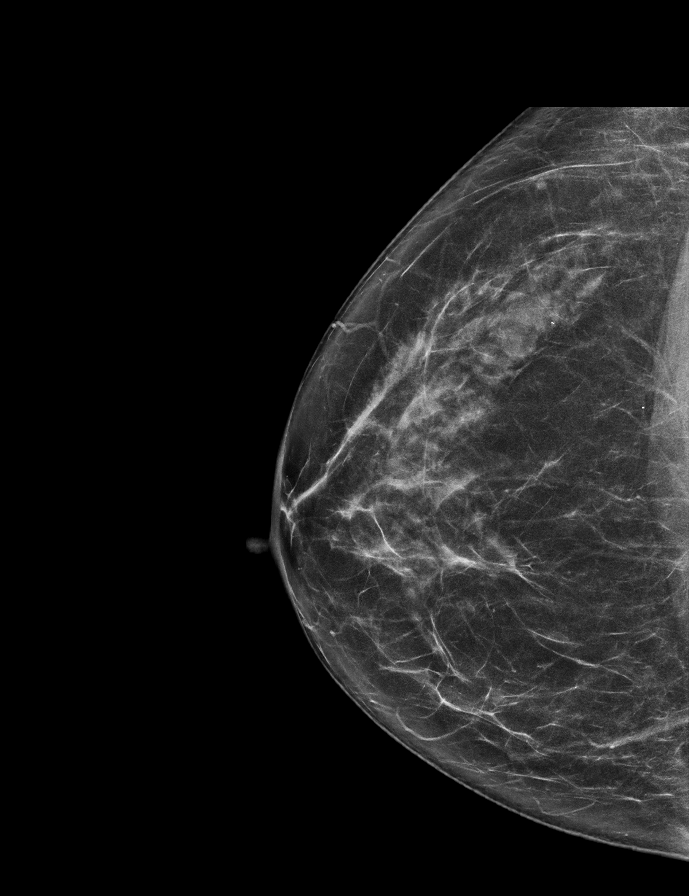

[R CC tomo · 2 of 67 frames shown]
[frame 22/67]
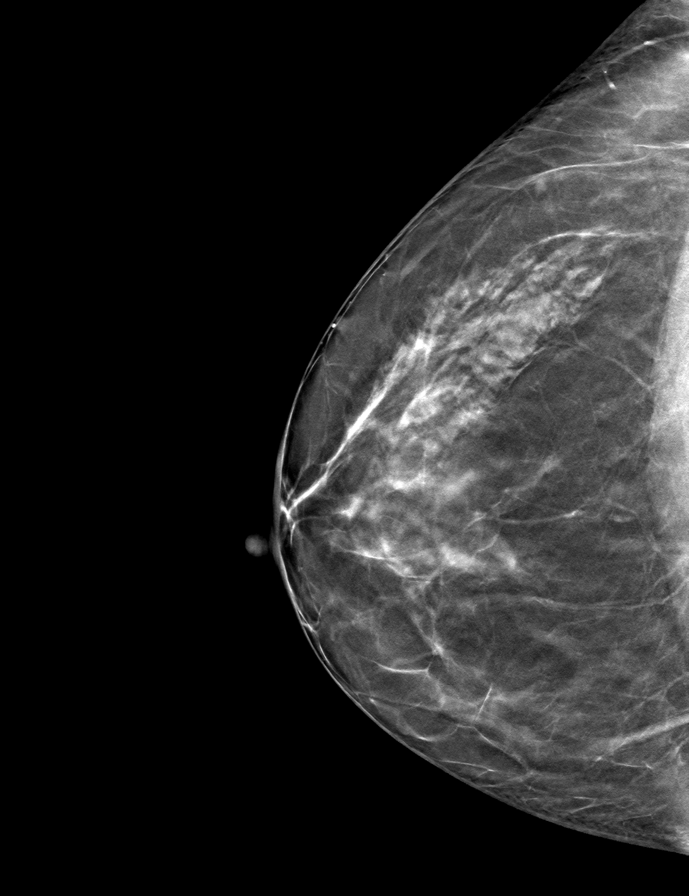
[frame 34/67]
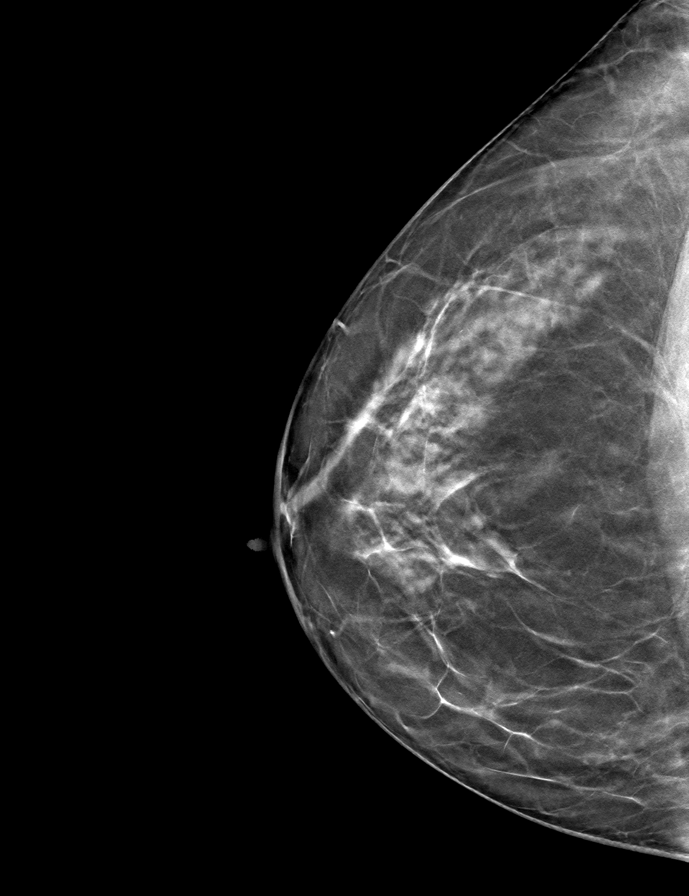

[L MLO tomo · tomo slice 31/61.0]
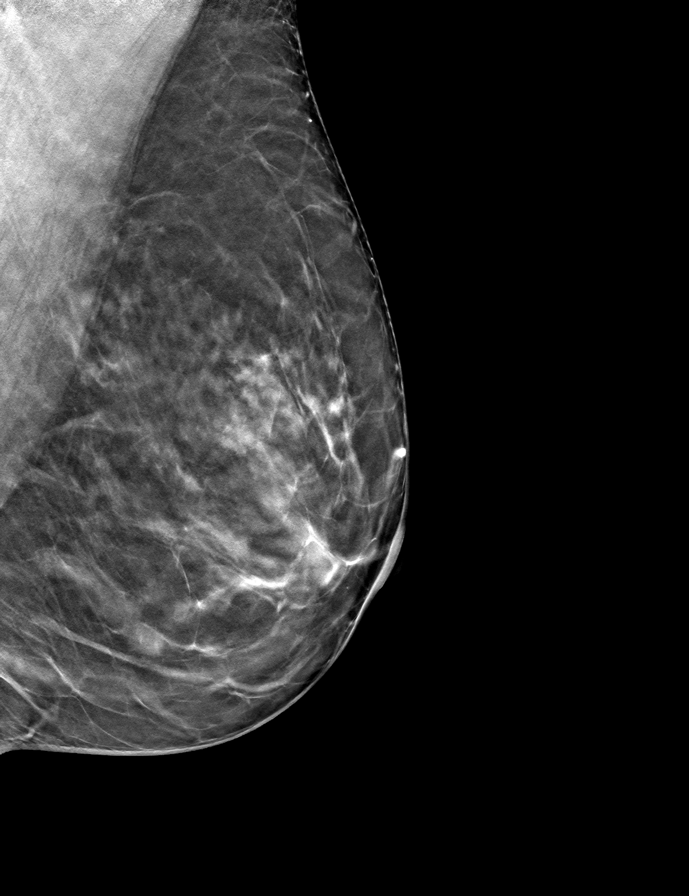

[R MLO tomo · tomo slice 31/61.0]
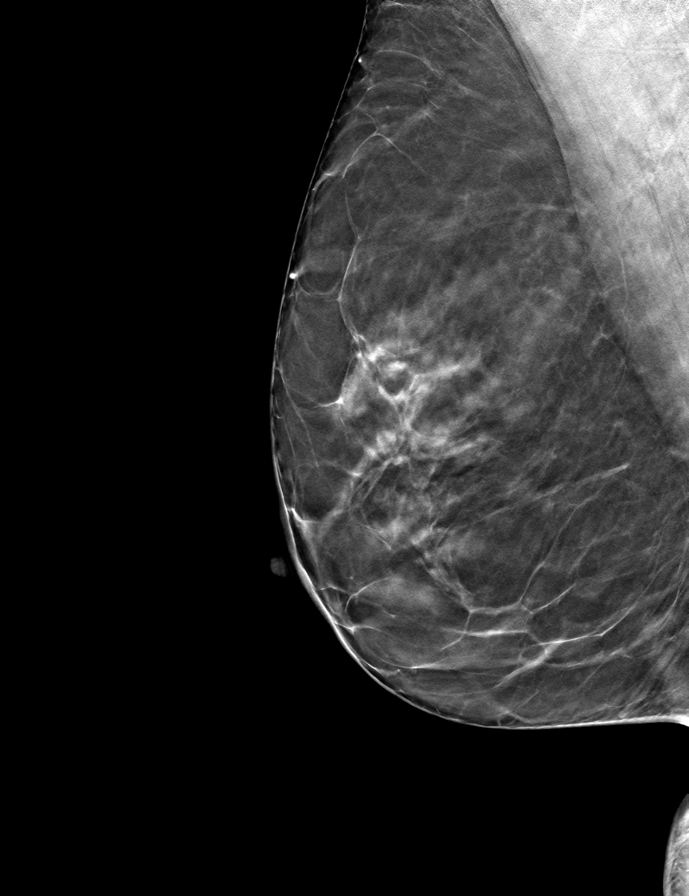

[L CC tomo · tomo slice 32/63.0]
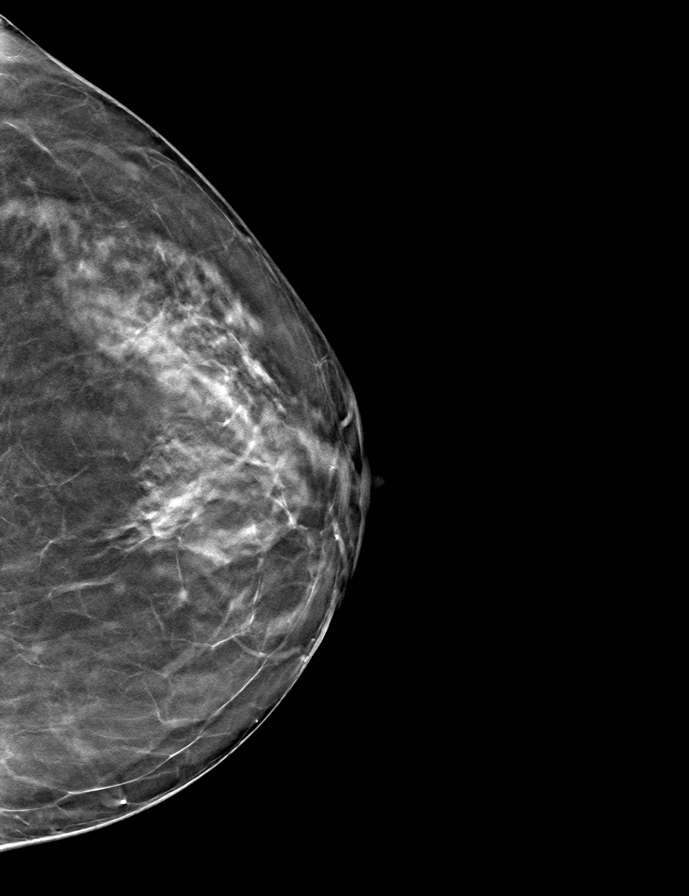

[9 of 24 positions shown; findings below may reference images not displayed]

ACR Breast Density Category b: There are scattered areas of
fibroglandular density.
FINDINGS: There are no findings suspicious for malignancy.
IMPRESSION: No mammographic evidence of malignancy. A result letter of this
screening mammogram will be mailed directly to the patient.

RECOMMENDATION:
Screening mammogram in one year. (Code:51-O-LD2)

BI-RADS CATEGORY  1: Negative.

## 2022-02-24 ENCOUNTER — Encounter: Payer: Self-pay | Admitting: Obstetrics and Gynecology

## 2022-02-24 ENCOUNTER — Ambulatory Visit (INDEPENDENT_AMBULATORY_CARE_PROVIDER_SITE_OTHER): Payer: No Typology Code available for payment source

## 2022-02-24 ENCOUNTER — Ambulatory Visit (INDEPENDENT_AMBULATORY_CARE_PROVIDER_SITE_OTHER): Payer: No Typology Code available for payment source | Admitting: Obstetrics and Gynecology

## 2022-02-24 ENCOUNTER — Other Ambulatory Visit (HOSPITAL_COMMUNITY)
Admission: RE | Admit: 2022-02-24 | Discharge: 2022-02-24 | Disposition: A | Discharge status: Home / Self Care | Payer: No Typology Code available for payment source | Source: Ambulatory Visit | Attending: Obstetrics and Gynecology | Admitting: Obstetrics and Gynecology

## 2022-02-24 VITALS — BP 122/80 | HR 64 | Ht 67.0 in | Wt 188.0 lb

## 2022-02-24 DIAGNOSIS — N950 Postmenopausal bleeding: Secondary | ICD-10-CM | POA: Insufficient documentation

## 2022-02-24 DIAGNOSIS — D219 Benign neoplasm of connective and other soft tissue, unspecified: Secondary | ICD-10-CM | POA: Insufficient documentation

## 2022-02-24 NOTE — Progress Notes (Signed)
GYNECOLOGY  VISIT   HPI: 56 y.o.   Single  Caucasian  female   G0P0 with Patient's last menstrual period was 08/15/2020.   here for U/S for postmenopausal bleeding 2 - 3 months ago.  Her FSH was 85.4 on 01/12/21.   GYNECOLOGIC HISTORY: Patient's last menstrual period was 08/15/2020. Contraception:  postmenopausal  Menopausal hormone therapy:  n/a Last mammogram:  BI-RADS CATEGORY  1: Negative. Last pap smear:   02/02/2022 - normal, Neg HR HVP        OB History     Gravida  0   Para      Term      Preterm      AB      Living         SAB      IAB      Ectopic      Multiple      Live Births                 There are no problems to display for this patient.   Past Medical History:  Diagnosis Date   Colon polyps 02/2010   benign   Depression    Environmental allergies    History of suicidal tendencies    Osteopenia 01/2016   T score -1.3 FRAX 3.8%/0.3%   Vitiligo     Past Surgical History:  Procedure Laterality Date   APPENDECTOMY     MOUTH SURGERY      Current Outpatient Medications  Medication Sig Dispense Refill   b complex vitamins tablet Take 1 tablet by mouth daily.     Calcium Carbonate-Vitamin D (CALCIUM + D PO) Take by mouth.     cetirizine (ZYRTEC) 10 MG tablet Take 10 mg by mouth daily.     clobetasol ointment (TEMOVATE) 1.24 % Apply 1 application topically 2 (two) times daily. apply in a thin layer bid x 2 weeks at a time as needed 30 g 0   CREATINE PO Take by mouth.     cyclobenzaprine (FLEXERIL) 5 MG tablet Take 5 mg by mouth at bedtime as needed.     fluticasone (FLONASE) 50 MCG/ACT nasal spray Place 2 sprays into the nose daily. Prn     montelukast (SINGULAIR) 10 MG tablet Take 10 mg by mouth at bedtime.     PARoxetine (PAXIL) 30 MG tablet Take 30 mg by mouth daily.     Sulfacetamide Sodium, Acne, 10 % LOTN Apply 1 Application topically daily.     zolpidem (AMBIEN) 5 MG tablet Take 5 mg by mouth at bedtime as needed.     No  current facility-administered medications for this visit.     ALLERGIES: Penicillins and Sudafed [pseudoephedrine hcl]  Family History  Problem Relation Age of Onset   Hyperlipidemia Mother    Heart disease Mother    Heart attack Mother    Stroke Father     Social History   Socioeconomic History   Marital status: Single    Spouse name: Not on file   Number of children: Not on file   Years of education: Not on file   Highest education level: Not on file  Occupational History   Not on file  Tobacco Use   Smoking status: Never   Smokeless tobacco: Never  Vaping Use   Vaping Use: Never used  Substance and Sexual Activity   Alcohol use: Not Currently   Drug use: No   Sexual activity: Not Currently  Comment: 1st intercourse 56 yo-Fewer than 5 partners  Other Topics Concern   Not on file  Social History Narrative   Not on file   Social Determinants of Health   Financial Resource Strain: Not on file  Food Insecurity: Not on file  Transportation Needs: Not on file  Physical Activity: Not on file  Stress: Not on file  Social Connections: Not on file  Intimate Partner Violence: Not on file    Review of Systems  All other systems reviewed and are negative.   PHYSICAL EXAMINATION:    BP 122/80 (BP Location: Right Arm, Patient Position: Sitting, Cuff Size: Normal)   Pulse 64   Ht '5\' 7"'$  (1.702 m)   Wt 188 lb (85.3 kg)   LMP 08/15/2020   SpO2 98%   BMI 29.44 kg/m     General appearance: alert, cooperative and appears stated age   Pelvic US Uterus - 5.89 x 4.23 x 3.18 cm.  3 subserosal fibroids:  2.40 cm, 0.89 cm, 0.80 cm.  EMS 2.02 mm.  No masses.  Left ovary 2.78 x 2.17 x 1.73 cm.  Right ovary 2.65 x 2.21 x 1.47 cm.  6.4 mm follicle.  No adnexal masses.  No free fluid.   EMB Consent done.  Hibiclens prep. Paracervical block with 10 cc 1% lidocaine, lot [id number], exp 05/18/23.  Tenaculum to anterior cervical lip.  Os finder used.  Pipelle passed to almost  6 cm x 2.  Tissue to pathology.  No complications.  No EBL.   Chaperone was present for exam:  Kimalexis, CMA  ASSESSMENT  Postmenopausal bleeding.  I suspect atrophy. Fibroids.   PLAN  Pelvic US images and report reviewed.  Fibroids dicussed.  FU EMB.   Final plan to follow, hopefully just observational management, if EMB is negative.    An After Visit Summary was printed and given to the patient.  20 min  total time was spent for this patient encounter, including preparation, face-to-face counseling with the patient, coordination of care, and documentation of the encounter in addition to performing the endometrial biopsy.

## 2022-02-24 NOTE — Patient Instructions (Signed)
Endometrial Biopsy  An endometrial biopsy is a procedure to remove tissue samples from the endometrium, which is the lining of the uterus. The tissue that is removed can then be checked under a microscope for disease. This procedure is used to diagnose conditions such as endometrial cancer, endometrial tuberculosis, polyps, or other inflammatory conditions. This procedure may also be used to investigate uterine bleeding to determine where you are in your menstrual cycle or how your hormone levels are affecting the lining of the uterus. Tell a health care provider about: Any allergies you have. All medicines you are taking, including vitamins, herbs, eye drops, creams, and over-the-counter medicines. Any problems you or family members have had with anesthetic medicines. Any bleeding problems you have. Any surgeries you have had. Any medical conditions you have. Whether you are pregnant or may be pregnant. What are the risks? Your health care provider will talk with you about risks. These may include: Bleeding. Pelvic infection. Puncture of the wall of the uterus with the biopsy device (rare). Allergic reactions to medicines. What happens before the procedure? Keep a record of your menstrual cycles as told by your health care provider. You may need to schedule your procedure for a specific time in your cycle. Bring a sanitary pad in case you need to wear one after the procedure. Ask your health care provider about: Changing or stopping your regular medicines. These include any diabetes medicines or blood thinners you take. Taking medicines such as aspirin and ibuprofen. These medicines can thin your blood. Do not take these medicines unless your health care provider tells you to. Taking over-the-counter medicines, vitamins, herbs, and supplements. Plan to have someone take you home from the hospital or clinic. What happens during the procedure? You will lie on an exam table with your feet  and legs supported as in a pelvic exam. Your health care provider will insert an instrument into your vagina to see your cervix. Your cervix will be cleansed with an antiseptic solution. A medicine (local anesthetic) will be used to numb the cervix. A forceps instrument will be used to hold your cervix steady for the biopsy. A thin, rod-like instrument (uterine sound) will be inserted through your cervix to determine the length of your uterus and the location where the biopsy sample will be removed. A thin, flexible tube (catheter) will be inserted through your cervix and into the uterus. The catheter will be used to collect the biopsy sample from your endometrial tissue. The tube and instruments will be removed, and the tissue sample will be sent to a lab for examination. The procedure may vary among health care providers and hospitals. What happens after the procedure? Your blood pressure, heart rate, breathing rate, and blood oxygen level will be monitored until you leave the hospital or clinic. It is up to you to get the results of your procedure. Ask your health care provider, or the department that is doing the procedure, when your results will be ready. Summary An endometrial biopsy is a procedure to remove tissue samples from the endometrium, which is the lining of the uterus. This procedure is used to diagnose conditions such as endometrial cancer, endometrial tuberculosis, polyps, or other inflammatory conditions. It is up to you to get the results of your procedure. Ask your health care provider, or the department that is doing the procedure, when your results will be ready. This information is not intended to replace advice given to you by your health care provider. Make sure you   discuss any questions you have with your health care provider. Document Revised: 08/18/2021 Document Reviewed: 08/18/2021 Elsevier Patient Education  2023 [employer]  

## 2022-02-25 LAB — SURGICAL PATHOLOGY

## 2022-10-04 ENCOUNTER — Other Ambulatory Visit: Payer: Self-pay | Admitting: Obstetrics and Gynecology

## 2022-10-04 DIAGNOSIS — Z1231 Encounter for screening mammogram for malignant neoplasm of breast: Secondary | ICD-10-CM

## 2022-10-15 ENCOUNTER — Telehealth: Payer: Self-pay | Admitting: Internal Medicine

## 2022-10-15 ENCOUNTER — Encounter: Payer: Self-pay | Admitting: Internal Medicine

## 2022-10-15 ENCOUNTER — Other Ambulatory Visit: Payer: Self-pay

## 2022-10-15 ENCOUNTER — Other Ambulatory Visit: Payer: Self-pay | Admitting: Internal Medicine

## 2022-10-15 ENCOUNTER — Ambulatory Visit (INDEPENDENT_AMBULATORY_CARE_PROVIDER_SITE_OTHER): Payer: No Typology Code available for payment source | Admitting: Internal Medicine

## 2022-10-15 VITALS — BP 126/70 | HR 72 | Temp 97.4°F | Resp 16 | Ht 68.0 in | Wt 200.0 lb

## 2022-10-15 DIAGNOSIS — K219 Gastro-esophageal reflux disease without esophagitis: Secondary | ICD-10-CM

## 2022-10-15 DIAGNOSIS — T63481D Toxic effect of venom of other arthropod, accidental (unintentional), subsequent encounter: Secondary | ICD-10-CM | POA: Diagnosis not present

## 2022-10-15 DIAGNOSIS — J310 Chronic rhinitis: Secondary | ICD-10-CM

## 2022-10-15 DIAGNOSIS — Z880 Allergy status to penicillin: Secondary | ICD-10-CM

## 2022-10-15 DIAGNOSIS — T63481A Toxic effect of venom of other arthropod, accidental (unintentional), initial encounter: Secondary | ICD-10-CM

## 2022-10-15 MED ORDER — CARBINOXAMINE MALEATE 4 MG PO TABS
4.0000 mg | ORAL_TABLET | Freq: Three times a day (TID) | ORAL | 5 refills | Status: DC
Start: 2022-10-15 — End: 2023-04-03

## 2022-10-15 MED ORDER — IPRATROPIUM BROMIDE 0.06 % NA SOLN: 2.0000 | Freq: Four times a day (QID) | NASAL | 12 refills | Status: DC

## 2022-10-15 MED ORDER — AZELASTINE HCL 0.1 % NA SOLN
1.0000 | Freq: Two times a day (BID) | NASAL | 12 refills | Status: DC
Start: 2022-10-15 — End: 2023-04-03

## 2022-10-15 MED ORDER — ESOMEPRAZOLE MAGNESIUM 40 MG PO CPDR: 40.0000 mg | DELAYED_RELEASE_CAPSULE | Freq: Every day | ORAL | 5 refills | Status: DC

## 2022-10-15 NOTE — Telephone Encounter (Signed)
Skin prick testing followed by oral challenge

## 2022-10-15 NOTE — Telephone Encounter (Signed)
Patient scheduled penicillin challenge for 11/15/2022

## 2022-10-15 NOTE — Telephone Encounter (Signed)
Okay to change to omeprazole 40 mg daily

## 2022-10-15 NOTE — Telephone Encounter (Signed)
Challenge protocol form has been mailed to patients home. Copy has been made and placed in bulk scanning.

## 2022-10-15 NOTE — Patient Instructions (Addendum)
Chronic Rhinitis Nonallergic: - allergy testing today: deferred, we will get blood work to confirm previous skin testing   - Prevention:  - allergen avoidance when possible - consider allergy shots as long term control of your symptoms by teaching your immune system to be more tolerant of your allergy triggers  - Symptom control: - Continue Nasal Steroid Spray: Best results if used daily. - Options include Flonase (fluticasone), Nasocort (triamcinolone), Nasonex (mometasome) 1- 2 sprays in each nostril daily.  - All can be purchased over-the-counter if not covered by insurance. - Start Astelin (azelastine) 1-2 sprays in each nostril twice a day as needed for nasal congestion/itchy nose - Start Atrovent (Ipratropium Bromide) 1-2 sprays in each nostril up to 3 times a day as needed for runny nose/post nasal drip/drainage.   - Use less frequently if airway gets too dry. - Continue Singulair (Montelukast) 10mg  nightly.  - Start Carbinoxamine 2-3 times a day    Penicillin Allergy  - Please schedule a follow up for penicillin skin testing.  A prescription has been placed for amoxicillin these bring this to that skin testing appointment for challenge.  -You will need to be off your carbinoxamine for 3 days prior to this appointment  Insect Reaction: - You had a large local reaction to insect sting/bite based on history  - your risk of a severe systemic reaction remains low but is slightly increased from the general population and we do not need to do anything different at this time - based on shared decision making, we have prescribed an Epipen to be used if stung and develop ANY ADDITIONAL symptoms besides skin findings (example-nausea, vomiting, diarrhea, lightheadedness, trouble breathing, etc). - if you are stung again and have other symptoms besides hives or localized swelling, please let us know as that would change our management  Mosquito/insect bites Avoidance measures (DEET repellant  for mosquitos, long clothing, etc) If bite occurs with raised rash:  - For itch: Topical steroid (hydrocortisone cream) twice daily as needed + oral antihistamine (zyrtec) - For pain and swelling: Oral anti-inflammatory (ibuprofen), ice affected area       Gastroesophageal Reflux Induced Respiratory Disease and Laryngopharyngeal Reflux (LPR):  Start omeprazole 40mg  daily   Gastroesophageal reflux disease (GERD) is a condition where the contents of the stomach reflux or back up into the esophagus or swallowing tube.  This can result in a variety of clinical symptoms including classic symptoms and atypical symptoms.  Classic symptoms of GERD include: heartburn, chest pain, acid taste in the mouth, and difficulty in swallowing.  Atypical symptoms of GERD include laryngopharyngeal reflux (LPR) and asthma.  LPR occurs when stomach reflux comes all the way up to the throat.  Clinical symptoms include hoarseness, raspy voice, laryngitis, throat clearing, postnasal drip, mucus stuck in the throat, a sensation of a lump in the throat, sore throat, and cough.  Most patients with LPR do not have classic symptoms of GERD.  Asthma can also be triggered by GERD.  The acid stomach fluid can stimulate nerve fibers in the esophagus which can cause an increase in bronchial muscle tone and narrowing of the airways.  Acid stomach contents may also reflux into the trachea and bronchi of the lungs where it can trigger an asthma attack.  Many people with GERD triggered asthma do not have classic symptoms of GERD.  Diagnosis of LPR and GERD induced asthma is frequently made from a typical history and response to medications.  It may take several months of  medications to see a good response.  Occasionally, a 24-hour esophageal pH probe study must be performed.  Treatment of GERD/LPR includes:   Modification of diet and lifestyle Stop smoking Avoid overeating and lose weight Avoid acidic and fatty foods, chocolate,  onions, garlic, peppermint Elevate the head of your bed 6 to 8 inches with blocks or wedge Medications Zantac, Pepcid, Axid, Tagamet Prilosec, Prevacid, Aciphex, Protonix, Nexium Surgery    Follow up: in 4 weeks in clinic  Follow up: for penicillin testing   Thank you so much for letting me partake in your care today.  Don't hesitate to reach out if you have any additional concerns!  Ferol Luz, MD  [facility]- [place], [place]

## 2022-10-15 NOTE — Telephone Encounter (Signed)
When would you like for the prescription for Amoxicillin to be sent to the pharmacy?

## 2022-10-15 NOTE — Telephone Encounter (Signed)
Are we doing oral or skin prick

## 2022-10-15 NOTE — Progress Notes (Signed)
New Patient Note  RE: Eileen Donovan MRN: [id number] DOB: 03/11/1966 Date of Office Visit: 10/15/2022  Consult requested by: Irven Coe, MD Primary care provider: Irven Coe, MD  Chief Complaint: Advice Only (Environmental: Seasonal/Perennial)  History of Present Illness: I had the pleasure of seeing Eileen Donovan for initial evaluation at the [facility] on 10/15/2022. She is a 57 y.o. female, who is referred here by Irven Coe, MD for the evaluation of allergic rhinitis .  History obtained from patient, chart review.  Chronic rhinitis: started 1993 after moving from [place] Symptoms include:  sinsu pressure, headache and post nasal drainage ear fullness - diagnosed with Eustachian tube dysfunction  Occurs seasonally-spring  Potential triggers: spring times, pressure change  Treatments tried: zyrtec, flonase, singulair,  Previous allergy testing: yes: 2019 - negative to all environmentals and foods (SPT and ID) History of reflux/heartburn:  rarely  History of chronic sinusitis or sinus surgery: no, Did see ENT in 2019 started singulair and "divers breathing"  Nonallergic triggers:  denies      Assessment and Plan: Eileen Donovan is a 57 y.o. 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 - Options include Flonase (fluticasone), Nasocort (triamcinolone), Nasonex  (mometasome) 1- 2 sprays in each nostril daily.  - All can be purchased over-the-counter if not covered by insurance. - Start Astelin (azelastine) 1-2 sprays in each nostril twice a day as needed for nasal congestion/itchy nose - Start Atrovent (Ipratropium Bromide) 1-2 sprays in each nostril up to 3 times a day as needed for runny nose/post nasal drip/drainage.   - Use less frequently if airway gets too dry. - Continue Singulair (Montelukast) 10mg  nightly.  - Start Carbinoxamine 2-3 times a day    Penicillin Allergy  - Please schedule a follow up for penicillin skin testing.  A prescription has been placed for amoxicillin these bring this to that skin testing appointment for challenge.  -You will need to be off your carbinoxamine for 3 days prior to this appointment  Insect Reaction: - You had a large local reaction to insect sting/bite based on history  - your risk of a severe systemic reaction remains low but is slightly increased from the general population and we do not need to do anything different at this time - based on shared decision making, we have prescribed an Epipen to be used if stung and develop ANY ADDITIONAL symptoms besides skin findings (example-nausea, vomiting, diarrhea, lightheadedness, trouble breathing, etc). - if you are stung again and have other symptoms besides hives or localized swelling, please let us know as that would change our management  Mosquito/insect bites Avoidance measures (DEET repellant for mosquitos, long clothing, etc) If bite occurs with raised rash:  - For itch: Topical steroid (hydrocortisone cream) twice daily as needed + oral antihistamine (zyrtec) - For pain and swelling: Oral anti-inflammatory (ibuprofen), ice affected area       Gastroesophageal Reflux Induced  Respiratory Disease and Laryngopharyngeal Reflux (LPR):  Start omeprazole 40mg  daily   Gastroesophageal reflux disease (GERD) is a condition where the contents of the stomach  reflux or back up into the esophagus or swallowing tube.  This can result in a variety of clinical symptoms including classic symptoms and atypical symptoms.  Classic symptoms of GERD include: heartburn, chest pain, acid taste in the mouth, and difficulty in swallowing.  Atypical symptoms of GERD include laryngopharyngeal reflux (LPR) and asthma.  LPR occurs when stomach reflux comes all the way up to the throat.  Clinical symptoms include hoarseness, raspy voice, laryngitis, throat clearing, postnasal drip, mucus stuck in the throat, a sensation of a lump in the throat, sore throat, and cough.  Most patients with LPR do not have classic symptoms of GERD.  Asthma can also be triggered by GERD.  The acid stomach fluid can stimulate nerve fibers in the esophagus which can cause an increase in bronchial muscle tone and narrowing of the airways.  Acid stomach contents may also reflux into the trachea and bronchi of the lungs where it can trigger an asthma attack.  Many people with GERD triggered asthma do not have classic symptoms of GERD.  Diagnosis of LPR and GERD induced asthma is frequently made from a typical history and response to medications.  It may take several months of medications to see a good response.  Occasionally, a 24-hour esophageal pH probe study must be performed.  Treatment of GERD/LPR includes:   Modification of diet and lifestyle Stop smoking Avoid overeating and lose weight Avoid acidic and fatty foods, chocolate, onions, garlic, peppermint Elevate the head of your bed 6 to 8 inches with blocks or wedge Medications Zantac, Pepcid, Axid, Tagamet Prilosec, Prevacid, Aciphex, Protonix, Nexium Surgery    Follow up: in 4 weeks in clinic  Follow up: for penicillin testing   Thank you so much for letting me partake in your care today.  Don't hesitate to reach out if you have any additional concerns!  Eileen Luz, MD  [facility] [place]    Meds  ordered this encounter  Medications   Carbinoxamine Maleate 4 MG TABS    Sig: Take 1 tablet (4 mg total) by mouth in the morning, at noon, and at bedtime.    Dispense:  90 tablet    Refill:  5   azelastine (ASTELIN) 0.1 % nasal spray    Sig: Place 1 spray into both nostrils 2 (two) times daily. Use in each nostril as directed    Dispense:  30 mL    Refill:  12   ipratropium (ATROVENT) 0.06 % nasal spray    Sig: Place 2 sprays into both nostrils 4 (four) times daily.    Dispense:  15 mL    Refill:  12   esomeprazole (NEXIUM) 40 MG capsule    Sig: Take 1 capsule (40 mg total) by mouth daily at 12 noon.    Dispense:  30 capsule    Refill:  5   Lab Orders         Allergens w/Total IgE Area 2         Allergen Hymenoptera Panel         Tryptase      Other allergy screening: Asthma: no Rhino conjunctivitis: yes - nonallergic  Food allergy: no Medication allergy:  YES- PCN causes hives at age 45, given for scarlet fever and developed persistent malaise, urticaria  for 3 months afterwards.  Urticaria again with PCN in 2001.   Hymenoptera allergy: yes: stung on right arm and developed LLR and will get LLR with mosquito allergy  Urticaria: no Eczema:no History of recurrent infections suggestive of immunodeficency: no  Diagnostics:  Skin Testing:  Deferred due to [facility] insurance  .    Past Medical History: Patient Active Problem List   Diagnosis Date Noted   Post-menopausal bleeding 02/24/2022   Fibroids 02/24/2022   Past Medical History:  Diagnosis Date   Colon polyps 02/2010   benign   Depression    Environmental allergies    History of suicidal tendencies    Osteopenia 01/2016   T score -1.3 FRAX 3.8%/0.3%   Vitiligo    Past Surgical History: Past Surgical History:  Procedure Laterality Date   APPENDECTOMY     MOUTH SURGERY     Medication List:  Current Outpatient Medications  Medication Sig Dispense Refill   azelastine (ASTELIN) 0.1 % nasal spray Place 1 spray  into both nostrils 2 (two) times daily. Use in each nostril as directed 30 mL 12   b complex vitamins tablet Take 1 tablet by mouth daily.     Calcium Carbonate-Vitamin D (CALCIUM + D PO) Take by mouth.     Carbinoxamine Maleate 4 MG TABS Take 1 tablet (4 mg total) by mouth in the morning, at noon, and at bedtime. 90 tablet 5   cetirizine (ZYRTEC) 10 MG tablet Take 10 mg by mouth daily.     clobetasol ointment (TEMOVATE) 0.05 % Apply 1 application topically 2 (two) times daily. apply in a thin layer bid x 2 weeks at a time as needed 30 g 0   CREATINE PO Take by mouth.     cyclobenzaprine (FLEXERIL) 5 MG tablet Take 5 mg by mouth at bedtime as needed.     esomeprazole (NEXIUM) 40 MG capsule Take 1 capsule (40 mg total) by mouth daily at 12 noon. 30 capsule 5   fluticasone (FLONASE) 50 MCG/ACT nasal spray Place 2 sprays into the nose daily. Prn     ipratropium (ATROVENT) 0.06 % nasal spray Place 2 sprays into both nostrils 4 (four) times daily. 15 mL 12   meloxicam (MOBIC) 15 MG tablet Take 15 mg by mouth daily as needed for pain.     montelukast (SINGULAIR) 10 MG tablet Take 10 mg by mouth at bedtime.     PARoxetine (PAXIL) 30 MG tablet Take 30 mg by mouth daily.     Sulfacetamide Sodium, Acne, 10 % LOTN Apply 1 Application topically daily.     zolpidem (AMBIEN) 5 MG tablet Take 5 mg by mouth at bedtime as needed.     No current facility-administered medications for this visit.   Allergies: Allergies  Allergen Reactions   Penicillins Nausea Only and Rash   Sudafed [Pseudoephedrine Hcl] Palpitations   Social History: Social History   Socioeconomic History   Marital status: Single    Spouse name: Not on file   Number of children: Not on file   Years of education: Not on file   Highest education level: Not on file  Occupational History   Not on file  Tobacco Use   Smoking status: Never    Passive exposure: Never   Smokeless tobacco: Never  Vaping Use   Vaping Use: Never used   Substance and Sexual Activity   Alcohol use: Not Currently   Drug use: Never   Sexual activity: Not Currently    Comment: 1st intercourse 57 yo-Fewer  than 5 partners  Other Topics Concern   Not on file  Social History Narrative   Not on file   Social Determinants of Health   Financial Resource Strain: Not on file  Food Insecurity: Not on file  Transportation Needs: Not on file  Physical Activity: Not on file  Stress: Not on file  Social Connections: Not on file   Lives in a single-family home, no roaches in the bed is not 204.  Dust mite precautions on bed but not pillows.  Not exposed to fumes, chemicals or dust.  No HEPA filter and [place] not [place] an [place] area. Smoking: no exposure  Occupation: Garment/textile technologist (computer work)   Landscape architect History: Immunologist in the house: no Engineer, civil (consulting) in the family room: no Carpet in the bedroom: no Heating: heat pump Cooling: central Pet: yes birds and cats inside of home   Family History: Family History  Problem Relation Age of Onset   Hyperlipidemia Mother    Heart disease Mother    Heart attack Mother    Stroke Father      ROS: All others negative except as noted per HPI.   Objective: BP 126/70 (BP Location: Right Arm, Patient Position: Sitting, Cuff Size: Large)   Pulse 72   Temp (!) 97.4 F (36.3 C) (Temporal)   Resp 16   Ht 5\' 8"  (1.727 m)   Wt 200 lb (90.7 kg)   LMP 08/15/2020   SpO2 96%   BMI 30.41 kg/m  Body mass index is 30.41 kg/m.  General Appearance:  Alert, cooperative, no distress, appears stated age  Head:  Normocephalic, without obvious abnormality, atraumatic  Eyes:  Conjunctiva clear, EOM's intact  Nose: Nares normal,  erythematous and edematous nasal mucosa, septum deviated to the left, hypertrophic turbinates, and no visible anterior polyps  Throat: Lips, tongue normal; teeth and gums normal,  erythema in posterior oropharynx and + cobblestoning  Neck: Supple,  symmetrical  Lungs:   clear to auscultation bilaterally, Respirations unlabored, no coughing  Heart:  regular rate and rhythm and no murmur, Appears well perfused  Extremities: No edema  Skin: Skin color, texture, turgor normal, no rashes or lesions on visualized portions of skin  Neurologic: No gross deficits   The plan was reviewed with the patient/family, and all questions/concerned were addressed.  It was my pleasure to see Yaremi today and participate in her care. Please feel free to contact me with any questions or concerns.  Sincerely,  Eileen Luz, MD Allergy & Immunology  [facility] office: [phone] [facility] office: [phone]

## 2022-10-19 LAB — ALLERGENS W/TOTAL IGE AREA 2
Alternaria Alternata IgE: <0.1 kU/L
Aspergillus Fumigatus IgE: <0.1 kU/L
Bermuda Grass IgE: <0.1 kU/L
Cat Dander IgE: <0.1 kU/L
Cedar, Mountain IgE: <0.1 kU/L
Cladosporium Herbarum IgE: <0.1 kU/L
Cockroach, German IgE: <0.1 kU/L
Common Silver Birch IgE: <0.1 kU/L
Cottonwood IgE: <0.1 kU/L
D Farinae IgE: <0.1 kU/L
D Pteronyssinus IgE: <0.1 kU/L
Dog Dander IgE: <0.1 kU/L
Elm, American IgE: <0.1 kU/L
IgE (Immunoglobulin E), Serum: 23 IU/mL (ref 6–495)
Johnson Grass IgE: <0.1 kU/L
Maple/Box Elder IgE: <0.1 kU/L
Mouse Urine IgE: <0.1 kU/L
Oak, White IgE: <0.1 kU/L
Pecan, Hickory IgE: <0.1 kU/L
Penicillium Chrysogen IgE: <0.1 kU/L
Pigweed, Rough IgE: <0.1 kU/L
Ragweed, Short IgE: <0.1 kU/L
Sheep Sorrel IgE Qn: <0.1 kU/L
Timothy Grass IgE: 0.22 kU/L — AB
White Mulberry IgE: <0.1 kU/L

## 2022-10-19 LAB — ALLERGEN HYMENOPTERA PANEL
Bumblebee: <0.1 kU/L
Honeybee IgE: <0.1 kU/L
Hornet, White Face, IgE: <0.1 kU/L
Hornet, Yellow, IgE: <0.1 kU/L
Paper Wasp IgE: <0.1 kU/L
Yellow Jacket, IgE: <0.1 kU/L

## 2022-10-19 LAB — TRYPTASE: Tryptase: 6.8 ug/L (ref 2.2–13.2)

## 2022-10-20 NOTE — Progress Notes (Signed)
Blood work was negative to environmental allergies and bee allergy. We can do confirmatory skin testing at follow up appointment to the environmentals.  Can someone let patient know?  Thanks!

## 2022-11-12 ENCOUNTER — Other Ambulatory Visit: Payer: Self-pay | Admitting: *Deleted

## 2022-11-12 MED ORDER — AMOXICILLIN 250 MG/5ML PO SUSR: ORAL | 0 refills | Status: DC

## 2022-11-12 NOTE — Telephone Encounter (Signed)
Medication has been sent in. Called patient and informed, patient verbalized understanding and stated that she will pick it up and bring it with her to her challenge.

## 2022-11-15 ENCOUNTER — Encounter: Payer: Self-pay | Admitting: Family Medicine

## 2022-11-15 ENCOUNTER — Ambulatory Visit (INDEPENDENT_AMBULATORY_CARE_PROVIDER_SITE_OTHER): Payer: No Typology Code available for payment source | Admitting: Family Medicine

## 2022-11-15 ENCOUNTER — Other Ambulatory Visit: Payer: Self-pay

## 2022-11-15 VITALS — BP 118/80 | HR 65 | Temp 97.9°F | Resp 16 | Wt 205.3 lb

## 2022-11-15 DIAGNOSIS — Z889 Allergy status to unspecified drugs, medicaments and biological substances status: Secondary | ICD-10-CM

## 2022-11-15 NOTE — Progress Notes (Signed)
[address] [place] [address] Dept: [phone]  FOLLOW UP NOTE  Patient ID: Eileen Donovan, female    DOB: 1965/06/23  Age: 57 y.o. MRN: [id number] Date of Office Visit: 11/15/2022  Assessment  Chief Complaint: Food/Drug Challenge (penicillin)  HPI Eileen Donovan is a 57 year old female who presents to the clinic for follow-up with penicillin skin testing and possible oral challenge to amoxicillin.  She was last seen in this clinic on 10/15/2022 by Dr. Marlynn Perking for evaluation of chronic rhinitis, penicillin allergy, possible insect allergy, large mosquito bites, and reflux.  On 10/15/2022 her lab work was negative for environmental allergy.  At today's visit, she reports that she is feeling well overall with no current cardiopulmonary, gastrointestinal or integumentary symptoms. She reports that she has not had any antihistamines in over three days.   Her current medications are listed in the chart.    Drug Allergies:  Allergies  Allergen Reactions   Sudafed [Pseudoephedrine Hcl] Palpitations    Physical Exam: BP 118/80   Pulse 65   Temp 97.9 F (36.6 C) (Temporal)   Resp 16   Wt 205 lb 4.8 oz (93.1 kg)   LMP 08/15/2020   SpO2 100%   BMI 31.22 kg/m    Physical Exam Vitals reviewed.  Constitutional:      Appearance: Normal appearance.  HENT:     Head: Normocephalic and atraumatic.     Right Ear: Tympanic membrane normal.     Left Ear: Tympanic membrane normal.     Nose:     Comments: Bilateral nares normal. Pharynx normal. Ears normal. Eyes normal.    Mouth/Throat:     Pharynx: Oropharynx is clear.  Eyes:     Conjunctiva/sclera: Conjunctivae normal.  Cardiovascular:     Rate and Rhythm: Normal rate and regular rhythm.     Heart sounds: Normal heart sounds. No murmur heard. Pulmonary:     Effort: Pulmonary effort is normal.     Breath sounds: Normal breath sounds.     Comments: Lungs clear to auscultation Musculoskeletal:        General: Normal range of  motion.     Cervical back: Normal range of motion and neck supple.  Skin:    General: Skin is warm and dry.     Comments: One red raised area on her right knee. She reports this is a bug bite.  Neurological:     Mental Status: She is alert and oriented to person, place, and time.  Psychiatric:        Mood and Affect: Mood normal.        Behavior: Behavior normal.        Thought Content: Thought content normal.        Judgment: Judgment normal.      Percutaneous Penicillin Testing Control SPT: negative Histamine SPT: 3+ Pre-Pen SPT:  +/-, repeat dose: negative Pen-G SPT: negative  Intradermal Penicillin Testing Control ID: negative  Pre-Pen ID: negative Pen-G (50 units/mL) ID: negative Pen-G (500 units/mL) ID: negative Pen-G (5000 units/mL) ID: negative  Testing was negative, therefore the patient will proceed with an oral amoxicillin challenge Procedure note: amoxicillin oral drug challenge Written consent obtained Open graded amoxicillin oral challenge: The patient was able to tolerate the challenge today without adverse signs or symptoms. Vital signs were stable throughout the challenge and observation period. She received multiple doses separated by 30 minutes, each of which was separated by vitals and a brief physical exam. She received the  following doses: 1%, 10%, and 89% for a total of 875 mg of amoxicillin. She was monitored for 60 minutes following the last dose.  Total testing time: 140 minutes  The patient had negative penicillin skin testing and was able to tolerate the open graded oral challenge today without adverse signs or symptoms. Therefore, she has the same risk of systemic reaction associated with  use of amoxicillin  as the general population.   Assessment and Plan: 1. Drug allergy     Patient Instructions  In office skin testing and oral drug testing for amoxicillin After negative skin testing, [name] was able to tolerate the amoxicillin drug  challenge today at the office without adverse signs or symptoms of an allergic reaction. Therefore, she has the same risk of systemic reaction associated with the consumption of amoxicillin as the general population.  - Do not give any amoxicillin or drugs containing penicillin for the next 24 hours. - Monitor for allergic symptoms such as rash, wheezing, diarrhea, swelling, and vomiting for the next 24 hours. If severe symptoms occur, treat with EpiPen injection and call 911. For less severe symptoms treat with Benadryl 50 mg  every 4 hours as needed and call the clinic.  - Penicillin has been removed from your allergy list.  Call the clinic if this treatment plan is not working well for you  Follow up in 3 months or sooner if needed.   Return in about 3 months (around 02/15/2023), or if symptoms worsen or fail to improve.    Thank you for the opportunity to care for this patient.  Please do not hesitate to contact me with questions.  Thermon Leyland, FNP [facility]

## 2022-11-15 NOTE — Patient Instructions (Addendum)
In office skin testing and oral drug testing for amoxicillin After negative skin testing, Eileen Donovan was able to tolerate the amoxicillin drug challenge today at the office without adverse signs or symptoms of an allergic reaction. Therefore, she has the same risk of systemic reaction associated with the consumption of amoxicillin as the general population.  - Do not give any amoxicillin or drugs containing penicillin for the next 24 hours. - Monitor for allergic symptoms such as rash, wheezing, diarrhea, swelling, and vomiting for the next 24 hours. If severe symptoms occur, treat with EpiPen injection and call 911. For less severe symptoms treat with Benadryl 50 mg  every 4 hours as needed and call the clinic.  - Penicillin has been removed from your allergy list.  Call the clinic if this treatment plan is not working well for you  Follow up in 3 months or sooner if needed.

## 2022-12-13 ENCOUNTER — Ambulatory Visit
Admission: RE | Admit: 2022-12-13 | Discharge: 2022-12-13 | Disposition: A | Discharge status: Home / Self Care | Payer: No Typology Code available for payment source | Source: Ambulatory Visit | Attending: Obstetrics and Gynecology | Admitting: Obstetrics and Gynecology

## 2022-12-13 DIAGNOSIS — Z1231 Encounter for screening mammogram for malignant neoplasm of breast: Secondary | ICD-10-CM

## 2023-02-14 ENCOUNTER — Encounter: Payer: Self-pay | Admitting: Family Medicine

## 2023-02-14 ENCOUNTER — Ambulatory Visit (INDEPENDENT_AMBULATORY_CARE_PROVIDER_SITE_OTHER): Payer: No Typology Code available for payment source | Admitting: Family Medicine

## 2023-02-14 VITALS — BP 110/60 | HR 72 | Temp 98.1°F | Resp 12 | Wt 186.0 lb

## 2023-02-14 DIAGNOSIS — T63481A Toxic effect of venom of other arthropod, accidental (unintentional), initial encounter: Secondary | ICD-10-CM | POA: Insufficient documentation

## 2023-02-14 DIAGNOSIS — T63481D Toxic effect of venom of other arthropod, accidental (unintentional), subsequent encounter: Secondary | ICD-10-CM | POA: Diagnosis not present

## 2023-02-14 DIAGNOSIS — J310 Chronic rhinitis: Secondary | ICD-10-CM | POA: Insufficient documentation

## 2023-02-14 DIAGNOSIS — K219 Gastro-esophageal reflux disease without esophagitis: Secondary | ICD-10-CM | POA: Diagnosis not present

## 2023-02-14 NOTE — Patient Instructions (Addendum)
Chronic rhinitis Well-controlled Continue an antihistamine once a day if needed for runny nose or itch Continue Flonase 2 sprays in each nostril once a day if needed for runny nose Consider saline nasal rinses as needed for nasal symptoms. Use this before any medicated nasal sprays for best result  Local insect sting reaction Stable Continue to avoid stinging insects. In case of an allergic reaction, take Benadryl 50 mg every 4 hours, and if life-threatening symptoms occur, inject with EpiPen 0.3 mg. Let us know of any insect stings with any additional symptoms  Mosquito/insect bites Stable Avoidance measures (DEET repellant for mosquitos, long clothing, etc) If bite occurs with raised rash:  - For itch: Topical steroid (hydrocortisone cream) twice daily as needed + oral antihistamine (zyrtec) - For pain and swelling: Oral anti-inflammatory (ibuprofen), ice affected area  Reflux Well-controlled Continue dietary lifestyle modifications as listed below If you begin to experience symptoms of reflux, restart omeprazole daily.  Call the clinic if this treatment plan is not working well for you.  Follow up in 1 year or sooner if needed.  Reducing Pollen Exposure The American Academy of Allergy, Asthma and Immunology suggests the following steps to reduce your exposure to pollen during allergy seasons. Do not hang sheets or clothing out to dry; pollen may collect on these items. Do not mow lawns or spend time around freshly cut grass; mowing stirs up pollen. Keep windows closed at night.  Keep car windows closed while driving. Minimize morning activities outdoors, a time when pollen counts are usually at their highest. Stay indoors as much as possible when pollen counts or humidity is high and on windy days when pollen tends to remain in the air longer. Use air conditioning when possible.  Many air conditioners have filters that trap the pollen spores. Use a HEPA room air filter to remove  pollen form the indoor air you breathe.

## 2023-02-14 NOTE — Progress Notes (Signed)
[address] [place] [address] Dept: [phone]  FOLLOW UP NOTE  Patient ID: Eileen Donovan, female    DOB: 1965/12/15  Age: 57 y.o. MRN: [id number] Date of Office Visit: 02/14/2023  Assessment  Chief Complaint: Follow-up  HPI Eileen Donovan is a 57 year old female who presents to the clinic for follow-up visit.  She was last seen in this clinic for a successful penicillin challenge on 11/15/2022.  Prior to that, she was seen in this clinic on 09/15/2022 as a new patient by Dr. Marlynn Perking for evaluation of chronic rhinitis, reaction to stinging insect, local reaction due to mosquito bite, and reflux.  At today's visit, she reports chronic rhinitis has been well-controlled with no symptoms including rhinorrhea, nasal congestion, sneezing, or postnasal drainage.  She is not currently using an antihistamine, a nasal steroid spray, or saline nasal rinses.  Her last environmental allergy testing was borderline positive to tree pollen on 10/15/2022.  She reports that she has not had any encounters with stinging insects since her last visit to this clinic.  Her last hymenoptera allergen panel on 10/15/2022 was negative for all insects tested.  Epinephrine autoinjector sets are up-to-date.  She reports that she has not had any mosquito bites since her last visit to this clinic.  Reflux is reported as well-controlled with no symptoms including heartburn or vomiting.  She is not currently taking omeprazole or any other medication to control reflux at this time.  Her current medications are listed in the chart.   Drug Allergies:  Allergies  Allergen Reactions   Sudafed [Pseudoephedrine Hcl] Palpitations    Physical Exam: BP 110/60   Pulse 72   Temp 98.1 F (36.7 C) (Temporal)   Resp 12   Wt 186 lb (84.4 kg)   LMP 08/15/2020   SpO2 96%   BMI 28.28 kg/m    Physical Exam Vitals reviewed.  Constitutional:      Appearance: Normal appearance.  HENT:     Head: Normocephalic and  atraumatic.     Right Ear: Tympanic membrane normal.     Left Ear: Tympanic membrane normal.     Nose:     Comments: Bilateral nares slightly erythematous with thin clear nasal drainage noted.  Pharynx normal.  Ears normal.  Eyes normal.    Mouth/Throat:     Pharynx: Oropharynx is clear.  Eyes:     Conjunctiva/sclera: Conjunctivae normal.  Cardiovascular:     Rate and Rhythm: Normal rate and regular rhythm.     Heart sounds: Normal heart sounds. No murmur heard. Pulmonary:     Effort: Pulmonary effort is normal.     Breath sounds: Normal breath sounds.     Comments: Lungs clear to auscultation Musculoskeletal:        General: Normal range of motion.     Cervical back: Normal range of motion and neck supple.  Skin:    General: Skin is warm and dry.  Neurological:     Mental Status: She is alert and oriented to person, place, and time.  Psychiatric:        Mood and Affect: Mood normal.        Behavior: Behavior normal.        Thought Content: Thought content normal.        Judgment: Judgment normal.     Assessment and Plan: 1. Chronic rhinitis   2. 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 LPRD (laryngopharyngeal reflux disease)     Patient Instructions  Chronic rhinitis Continue an antihistamine once a day if needed for runny nose or itch Continue Flonase 2 sprays in each nostril once a day if needed for runny nose Consider saline nasal rinses as needed for nasal symptoms. Use this before any medicated nasal sprays for best result  Local insect sting reaction Continue to avoid stinging insects. In case of an allergic reaction, take Benadryl 50 mg every 4 hours, and if life-threatening symptoms occur, inject with EpiPen 0.3 mg. Let us know if any additional stings with any additional symptoms  Mosquito/insect bites Avoidance measures (DEET repellant for mosquitos, long clothing, etc) If bite occurs with raised rash:  - For itch: Topical steroid (hydrocortisone cream)  twice daily as needed + oral antihistamine (zyrtec) - For pain and swelling: Oral anti-inflammatory (ibuprofen), ice affected area  Reflux Continue dietary lifestyle modifications as listed below If you begin to experience symptoms of reflux, restart omeprazole daily.  Call the clinic if this treatment plan is not working well for you.  Follow up in 1 year or sooner if needed.   Return in about 1 year (around 02/14/2024), or if symptoms worsen or fail to improve.    Thank you for the opportunity to care for this patient.  Please do not hesitate to contact me with questions.  Thermon Leyland, FNP [facility]  To have

## 2023-04-03 ENCOUNTER — Telehealth: Payer: No Typology Code available for payment source | Admitting: Family

## 2023-04-03 DIAGNOSIS — S61452A Open bite of left hand, initial encounter: Secondary | ICD-10-CM

## 2023-04-03 DIAGNOSIS — W5501XA Bitten by cat, initial encounter: Secondary | ICD-10-CM | POA: Diagnosis not present

## 2023-04-03 MED ORDER — AMOXICILLIN-POT CLAVULANATE 875-125 MG PO TABS: 1.0000 | ORAL_TABLET | Freq: Two times a day (BID) | ORAL | 0 refills | Status: DC

## 2023-04-03 NOTE — Progress Notes (Signed)
Virtual Visit Consent   Ranae Pila, you are scheduled for a virtual visit with a [facility] provider today. Just as with appointments in the office, your consent must be obtained to participate. Your consent will be active for this visit and any virtual visit you may have with one of our providers in the next 365 days. If you have a MyChart account, a copy of this consent can be sent to you electronically.  As this is a virtual visit, video technology does not allow for your provider to perform a traditional examination. This may limit your provider's ability to fully assess your condition. If your provider identifies any concerns that need to be evaluated in person or the need to arrange testing (such as labs, EKG, etc.), we will make arrangements to do so. Although advances in technology are sophisticated, we cannot ensure that it will always work on either your end or our end. If the connection with a video visit is poor, the visit may have to be switched to a telephone visit. With either a video or telephone visit, we are not always able to ensure that we have a secure connection.  By engaging in this virtual visit, you consent to the provision of healthcare and authorize for your insurance to be billed (if applicable) for the services provided during this visit. Depending on your insurance coverage, you may receive a charge related to this service.  I need to obtain your verbal consent now. Are you willing to proceed with your visit today? Eileen Donovan has provided verbal consent on 04/03/2023 for a virtual visit (video or telephone). Jannifer Rodney, FNP  Date: 04/03/2023 5:18 PM  Virtual Visit via Video Note   I, Jannifer Rodney, connected with  Eileen Donovan  ([id number], Sep 03, 1965) on 04/03/23 at  5:15 PM EST by a video-enabled telemedicine application and verified that I am speaking with the correct person using two identifiers.  Location: Patient: Virtual Visit Location Patient:  Home Provider: Virtual Visit Location Provider: Home Office   I discussed the limitations of evaluation and management by telemedicine and the availability of in person appointments. The patient expressed understanding and agreed to proceed.    History of Present Illness: Eileen Donovan is a 57 y.o. who identifies as a female who was assigned female at birth, and is being seen today for cat bite of left hand that occurred this morning. She reports she reports mild swelling and redness. Reports aching throbbing pain of 1-2 if still, but moving 5 out 10.  Reports her cat is UTD on all his immunization.   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 apply in a thin layer bid x 2 weeks at a time as needed, Disp: 30 g, Rfl:  0   CREATINE PO, Take by mouth., Disp: , Rfl:    cyclobenzaprine (FLEXERIL) 5 MG tablet, Take 5 mg by mouth at bedtime as needed., Disp: , Rfl:    meloxicam (MOBIC) 15 MG tablet, Take 15 mg by mouth daily as needed for pain., Disp: , Rfl:    PARoxetine (PAXIL) 30 MG tablet, Take 30 mg by mouth daily., Disp: , Rfl:    Sulfacetamide Sodium, Acne, 10 % LOTN, Apply 1 Application topically daily., Disp: , Rfl:    zolpidem (AMBIEN) 5 MG tablet, Take 5 mg by  mouth at bedtime as needed., Disp: , Rfl:   Observations/Objective: Patient is well-developed, well-nourished in no acute distress.  Resting comfortably at home.  Head is normocephalic, atraumatic.  No labored breathing.  Speech is clear and coherent with logical content.  Patient is alert and oriented at baseline.  Redness of left hand  Assessment and Plan: 1. Cat bite of left hand, initial encounter - amoxicillin-clavulanate (AUGMENTIN) 875-125 MG tablet; Take 1 tablet by mouth 2 (two) times daily.  Dispense: 20 tablet; Refill: 0  Start Augmentin today Discussed importance of increased redness, fever, pain, or discharge needs to be seen in person. May need IV antibiotics Keep clean and dry Follow up as needed   Follow Up Instructions: I discussed the assessment and treatment plan with the patient. The patient was provided an opportunity to ask questions and all were answered. The patient agreed with the plan and demonstrated an understanding of the instructions.  A copy of instructions were sent to the patient via MyChart unless otherwise noted below.     The patient was advised to call back or seek an in-person evaluation if the symptoms worsen or if the condition fails to improve as anticipated.    Jannifer Rodney, FNP

## 2023-04-05 ENCOUNTER — Other Ambulatory Visit (HOSPITAL_BASED_OUTPATIENT_CLINIC_OR_DEPARTMENT_OTHER): Payer: Self-pay

## 2023-04-05 MED ORDER — LISDEXAMFETAMINE DIMESYLATE 50 MG PO CAPS
50.0000 mg | ORAL_CAPSULE | Freq: Every day | ORAL | 0 refills | Status: DC
  Filled 2023-04-05 – 2023-04-12 (×2): qty 30, 30d supply, fill #0

## 2023-04-12 ENCOUNTER — Other Ambulatory Visit (HOSPITAL_BASED_OUTPATIENT_CLINIC_OR_DEPARTMENT_OTHER): Payer: Self-pay

## 2023-04-13 ENCOUNTER — Other Ambulatory Visit (HOSPITAL_BASED_OUTPATIENT_CLINIC_OR_DEPARTMENT_OTHER): Payer: Self-pay

## 2023-05-09 ENCOUNTER — Other Ambulatory Visit (HOSPITAL_BASED_OUTPATIENT_CLINIC_OR_DEPARTMENT_OTHER): Payer: Self-pay

## 2023-05-10 ENCOUNTER — Other Ambulatory Visit (HOSPITAL_BASED_OUTPATIENT_CLINIC_OR_DEPARTMENT_OTHER): Payer: Self-pay

## 2023-05-10 MED ORDER — LISDEXAMFETAMINE DIMESYLATE 50 MG PO CAPS
50.0000 mg | ORAL_CAPSULE | Freq: Every day | ORAL | 0 refills | Status: DC
  Filled 2023-05-10: qty 30, 30d supply, fill #0

## 2023-05-13 ENCOUNTER — Other Ambulatory Visit (HOSPITAL_BASED_OUTPATIENT_CLINIC_OR_DEPARTMENT_OTHER): Payer: Self-pay

## 2023-05-17 ENCOUNTER — Other Ambulatory Visit (HOSPITAL_BASED_OUTPATIENT_CLINIC_OR_DEPARTMENT_OTHER): Payer: Self-pay

## 2023-05-19 ENCOUNTER — Encounter: Payer: Self-pay | Admitting: Obstetrics and Gynecology

## 2023-05-19 NOTE — Progress Notes (Signed)
 58 y.o. G0P0 Single Caucasian female here for annual exam. Denies any more bleeding.  Evaluation in October 2023 showed uterine fibroids and normal ovaries on her ultrasound and atrophic endometrium on her endometrial biopsy.  Will do her pneumonia vaccine.   New dx of ADHD.   Not using clobetasol  for lichen sclerosus.  She is not having symptoms.   PCP: Leonel Cole, MD   Patient's last menstrual period was 08/15/2020.           Sexually active: No.  The current method of family planning is post menopausal status.    Menopausal hormone therapy: none Exercising: Yes.     6 days a week. Smoker: no  OB History  Gravida Para Term Preterm AB Living  0       SAB IAB Ectopic Multiple Live Births           HEALTH MAINTENANCE: Last 2 paps: 12/21/2017-WNL, HPV- neg, 02/02/2022-WNL, HPV- neg History of abnormal Pap or positive HPV: no Mammogram: 12/13/2022-neg birads 1, Cat B Colonoscopy: 2019, Due 2024-needs to schedule Bone Density: 08/10/2021  Result osteopenia (T-score -1.3)  Immunization History  Administered Date(s) Administered   Influenza,inj,Quad PF,6+ Mos 02/07/2017   Influenza-Unspecified 12/26/2016   Moderna Sars-Covid-2 Vaccination 04/25/2020      reports that she has never smoked. She has never been exposed to tobacco smoke. She has never used smokeless tobacco. She reports that she does not currently use alcohol. She reports that she does not use drugs.  Past Medical History:  Diagnosis Date   ADHD    Colon polyps 02/2010   benign   Depression    Environmental allergies    Fibroids    History of suicidal tendencies    Osteopenia 01/2016   T score -1.3 FRAX 3.8%/0.3%   Vitiligo     Past Surgical History:  Procedure Laterality Date   APPENDECTOMY     MOUTH SURGERY      Current Outpatient Medications  Medication Sig Dispense Refill   b complex vitamins tablet Take 1 tablet by mouth daily.     Calcium Carbonate-Vitamin D  (CALCIUM + D PO) Take by mouth.      clobetasol  ointment (TEMOVATE ) 0.05 % Apply 1 application topically 2 (two) times daily. apply in a thin layer bid x 2 weeks at a time as needed 30 g 0   CREATINE PO Take by mouth.     CYCLOBENZAPRINE HCL PO Take by mouth.     lisdexamfetamine (VYVANSE ) 50 MG capsule Take 1 capsule (50 mg total) by mouth daily. 30 capsule 0   meloxicam  (MOBIC ) 15 MG tablet Take 15 mg by mouth daily as needed for pain.     PARoxetine  (PAXIL ) 30 MG tablet Take 30 mg by mouth daily.     Sulfacetamide Sodium, Acne, 10 % LOTN Apply 1 Application topically daily.     zolpidem  (AMBIEN ) 5 MG tablet Take 5 mg by mouth at bedtime as needed.     No current facility-administered medications for this visit.    ALLERGIES: Sudafed [pseudoephedrine hcl]  Family History  Problem Relation Age of Onset   Hyperlipidemia Mother    Heart disease Mother    Heart attack Mother    Stroke Father     Review of Systems  All other systems reviewed and are negative.   PHYSICAL EXAM:  BP 128/84   Pulse 66   Ht 5' 7 (1.702 m)   Wt 178 lb (80.7 kg)   LMP 08/15/2020  SpO2 97%   BMI 27.88 kg/m     General appearance: alert, cooperative and appears stated age Head: normocephalic, without obvious abnormality, atraumatic Neck: no adenopathy, supple, symmetrical, trachea midline and thyroid  normal to inspection and palpation Lungs: clear to auscultation bilaterally Breasts: normal appearance, no masses or tenderness, No nipple retraction or dimpling, No nipple discharge or bleeding, No axillary adenopathy Heart: regular rate and rhythm Abdomen: soft, non-tender; no masses, no organomegaly Extremities: extremities normal, atraumatic, no cyanosis or edema Skin: skin color, texture, turgor normal. No rashes or lesions Lymph nodes: cervical, supraclavicular, and axillary nodes normal. Neurologic: grossly normal  Pelvic: External genitalia:  decreased and increased pigmentation of the vulva.               No abnormal  inguinal nodes palpated.              Urethra:  normal appearing urethra with no masses, tenderness or lesions              Bartholins and Skenes: normal                 Vagina: normal appearing vagina with normal color and discharge, no lesions              Cervix: no lesions              Pap taken: No. Bimanual Exam:  Uterus:  normal size, contour, position, consistency, mobility, non-tender              Adnexa: no mass, fullness, tenderness              Rectal exam: Yes.  .  Confirms.              Anus:  normal sphincter tone, no lesions  Chaperone was present for exam:  Waddell MATSU, CMA  ASSESSMENT: Well woman visit with gynecologic exam Lichen sclerosus.  Asymptomatic.  Vitiligo. Osteopenia.   PLAN: Mammogram screening discussed. Self breast awareness reviewed. Pap and HRV collected:  No.  Due in 2028.  Guidelines for Calcium, Vitamin D , regular exercise program including cardiovascular and weight bearing exercise. Medication refills:  NA BMD in 2026.  Follow up:  1 year and prn.

## 2023-05-23 ENCOUNTER — Encounter: Payer: Self-pay | Admitting: Obstetrics and Gynecology

## 2023-05-23 ENCOUNTER — Ambulatory Visit (INDEPENDENT_AMBULATORY_CARE_PROVIDER_SITE_OTHER): Payer: No Typology Code available for payment source | Admitting: Obstetrics and Gynecology

## 2023-05-23 VITALS — BP 128/84 | HR 66 | Ht 67.0 in | Wt 178.0 lb

## 2023-05-23 DIAGNOSIS — Z01419 Encounter for gynecological examination (general) (routine) without abnormal findings: Secondary | ICD-10-CM

## 2023-05-23 NOTE — Patient Instructions (Signed)

## 2023-05-25 ENCOUNTER — Other Ambulatory Visit (HOSPITAL_BASED_OUTPATIENT_CLINIC_OR_DEPARTMENT_OTHER): Payer: Self-pay

## 2023-05-25 MED ORDER — LISDEXAMFETAMINE DIMESYLATE 50 MG PO CAPS
50.0000 mg | ORAL_CAPSULE | Freq: Every day | ORAL | 0 refills | Status: DC
  Filled 2023-06-14: qty 30, 30d supply, fill #0

## 2023-05-25 MED ORDER — LISDEXAMFETAMINE DIMESYLATE 50 MG PO CAPS
50.0000 mg | ORAL_CAPSULE | Freq: Every day | ORAL | 0 refills | Status: DC
  Filled 2023-09-13 – 2023-09-22 (×2): qty 30, 30d supply, fill #0

## 2023-05-25 MED ORDER — LISDEXAMFETAMINE DIMESYLATE 50 MG PO CAPS
50.0000 mg | ORAL_CAPSULE | Freq: Every day | ORAL | 0 refills | Status: DC
  Filled 2023-07-22: qty 30, 30d supply, fill #0

## 2023-05-25 MED ORDER — PREVNAR 20 0.5 ML IM SUSY
0.5000 mL | PREFILLED_SYRINGE | Freq: Once | INTRAMUSCULAR | 0 refills | Status: AC
Start: 2023-05-25 — End: 2023-05-26
  Filled 2023-05-25: qty 0.5, 1d supply, fill #0

## 2023-06-02 ENCOUNTER — Other Ambulatory Visit (HOSPITAL_BASED_OUTPATIENT_CLINIC_OR_DEPARTMENT_OTHER): Payer: Self-pay

## 2023-06-02 MED ORDER — MELOXICAM 15 MG PO TABS
15.0000 mg | ORAL_TABLET | Freq: Every day | ORAL | 0 refills | Status: DC
  Filled 2023-06-02: qty 30, 30d supply, fill #0

## 2023-06-10 ENCOUNTER — Other Ambulatory Visit (HOSPITAL_BASED_OUTPATIENT_CLINIC_OR_DEPARTMENT_OTHER): Payer: Self-pay

## 2023-06-14 ENCOUNTER — Other Ambulatory Visit: Payer: Self-pay

## 2023-06-14 ENCOUNTER — Other Ambulatory Visit (HOSPITAL_BASED_OUTPATIENT_CLINIC_OR_DEPARTMENT_OTHER): Payer: Self-pay

## 2023-07-14 ENCOUNTER — Ambulatory Visit: Payer: No Typology Code available for payment source | Admitting: Licensed Clinical Social Worker

## 2023-07-14 DIAGNOSIS — F3181 Bipolar II disorder: Secondary | ICD-10-CM | POA: Diagnosis not present

## 2023-07-14 NOTE — Progress Notes (Signed)
 [facility] Counselor/Therapist Progress Note  Patient ID: Eileen Donovan, MRN: [id number]    Date: 07/14/23  Time Spent: 1000  am - 1100 am : 60 Minutes  Treatment Type: Individual Therapy.  Reported Symptoms: Patient reports   Mental Status Exam: Appearance:  Casual     Behavior: Appropriate  Motor: Normal  Speech/Language:  Clear and Coherent  Affect: Appropriate  Mood: normal  Thought process: normal  Thought content:   WNL  Sensory/Perceptual disturbances:   WNL  Orientation: oriented to person, place, time/date, situation, day of week, month of year, and year  Attention: Good  Concentration: Good  Memory: WNL  Fund of knowledge:  Good  Insight:   Good  Judgment:  Good  Impulse Control: Good   Risk Assessment: Danger to Self:  No Self-injurious Behavior: No Danger to Others: No Duty to Warn:no Physical Aggression / Violence:No  Access to Firearms a concern: No  Gang Involvement:No  Presenting Problem Chief Complaint: Patient reports a difficult childhood. Patient reports recent diagnosis of ADHD. 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 She states that over the past several years she has experienced suicidal ideations.  What are the main stressors in your life right now, how long? Depression  3, Anxiety   3, Work Problems   3, Racing Thoughts   3, Excessive Worrying   3, and Suicidal Thoughts   2   Previous mental health services Have you ever been treated for a mental health problem, when, where, by whom? Yes  previous and current /PCP  Are you currently seeing a therapist or counselor, counselor's name? No   Have you ever had a mental health hospitalization, how many times, length of stay? No NA  Have you ever been treated with medication, name, reason, response? Yes, ADHD, patient reports that 2002 the first time having suicidal thoughts, overworked in [place].  Have you ever had suicidal thoughts or attempted suicide, when, how?  Yes,  2002, patient reports having a vision of   Risk factors for Suicide Demographic factors:  Caucasian and Living alone Current mental status: No plan to harm self or others Loss factors: NA Historical factors: NA, previous SI Risk Reduction factors: Employed Clinical factors:  Depression:   Symptoms of depression Cognitive features that contribute to risk: NA    SUICIDE RISK:  Mild:  Suicidal ideation of limited frequency, intensity, duration, and specificity.  There are no identifiable plans, no associated intent, mild dysphoria and related symptoms, good self-control (both objective and subjective assessment), few other risk factors, and identifiable protective factors, including available and accessible social support.  Medical history Medical treatment and/or problems, explain: No NA Do you have any issues with chronic pain?  No NA Name of primary care physician/last physical exam: Dr. Carmelia Roller, July 2024  Allergies: Yes Medication, reactions? Sudafed-increased heart rate.   Current medications: B-vitamin, Mobic, Paxil, Vit. D, Temovate, creatine PO Cyclobenzaprine HCL PO, Vyvanse, Ambien Prescribed by: Dr. Lewie Chamber Is there any history of mental health problems or substance abuse in your family, whom? Yes Maternal Grandmother, Perhaps Mother. Has anyone in your family been hospitalized, who, where, length of stay? No NA  Social/family history Have you been married, how many times?  0  Do you have children?  0  How many pregnancies have you had?  0  Who lives in your current household? Patient lives alone  Military history: No   Religious/spiritual involvement: NO What religion/faith base are you? NO  Family  of origin (childhood history)  Mother, Father patient and her sister. Where were you born? [place] Where did you grow up? [place] How many different homes have you lived? 6 Describe the atmosphere of the household where you grew up: Patient reports a very  unhealthy home environment as a child. She states there was a lot of emotional abuse and that she always felt as though she didn't belong. Do you have siblings, step/half siblings, list names, relation, sex, age? Yes Ute-60  Are your parents separated/divorced, when and why? Yes Unknown  Are your parents alive? Yes   Social supports (personal and professional): Patient reports that she is involved in several community organizations, but lacks close relationships.  Education How many grades have you completed? post college graduate work or degree Did you have any problems in school, what type? No  Medications prescribed for these problems? No   Employment (financial issues): Patient is employed full time and denied current financial issues.   Legal history: Denied    Trauma/Abuse history: Have you ever been exposed to any form of abuse, what type? Yes emotional  Have you ever been exposed to something traumatic, describe? Yes Patient identified her childhood and the emotional toll it took on her as traumatic.  Substance use Do you use Caffeine? Yes Type, frequency? daily  Do you use Nicotine? No Type, frequency, ppd? NA   Do you use Alcohol? No Type, frequency? NA  How old were you went you first tasted alcohol? NA Was this accepted by your family? NA  When was your last drink, type, how much? NA  Have you ever used illicit drugs or taken more than prescribed, type, frequency, date of last usage? No     Diagnosis AXIS I Bipolar, Depressed  AXIS II No diagnosis  AXIS III @PMH @  AXIS IV problems related to social environment and problems with primary support group  AXIS V 51-60 moderate symptoms   Subjective:   Eileen Donovan participated from office, located at [employer] with Clinician present. Eileen Donovan consented to treatment.   Eileen Donovan is a 58 year old female. She was born in [place] and currently resides in the [place]. She reports a history of strained relationships  with family and lacks close relationships. She reports that she has frequent thoughts of suicide and thinks of doing it in front of people who she feels has wronged her. She reports that she feels unappreciated at work and this is an area of extenuating stress for her. She states that she has poor self esteem stemming from childhood where her mother would talk about her size and compare her to her younger sister who she feels that her parents loved more than her. Patient states she is unhappy and desires to improve her mood as well as her ability to feel good about herself.   Interventions: Cognitive Behavioral Therapy and Assertiveness/Communication  Diagnosis: Bipolar II  TREATMENT PLAN  Treatment Plan:  PT reports depression with frequent suicidal ideations. Patient reports that she thinks of ways that she could kill herself in front of those that she she feels has wronged her. She reports lacking close relationships and has never been married. She states she has never felt a connection to anyone in an intimate manner. Patient reports stress from work and states she has taken some time off with her doctors advice. Patient reports strained relationships with both her parents and her younger sister. Patient denied serious medical conditions and states she goes to the gym  and attempts to stay healthy. Patient reports that she has several community activities she is involved in that helps her stay active in the community. Patient identified a desire to improve her coping skills to assist in controlling her emotions.   Client Abilities/Strengths:  Patient reports being very focussed at her work and cares about doing her job properly.  Support System: Patient denies a support Tree surgeon Therapy  Client Statement of Needs       "I want to have more control over my emotions and suicidal thoughts." Treatment Level Every other week  Symptoms: Anxiety,  Depression with frequent suicidal ideations.  Goals: "I want to have more control over my emotions."   Target Date: 07/14/2023 Frequency: biweekly  Progress: 0 Modality: individual   Patient will:  Patient will reduce the frequency and severity of depressive episodes.  2. Prevent hypomania from escalating into full-blown mania.  3. Identify early warning signs of mood shifts and take proactive steps to manage them.  4. Challenge negative thought patterns associated with depression and hypomania.  5. Develop coping skills to manage stress and difficult emotions.  6. Improve problem-solving abilities.    Therapist will: Therapist will provide referrals for additional resources as appropriate.  Therapist will provide psycho-education regarding anxiety, panic and depression related to her diagnosis and corresponding treatment approaches and interventions. Licensed Clinical Social Worker, Phyllis Ginger, LCSW will support the patient's ability to achieve the goals identified. will employ CBT, BA, Problem-solving, Solution Focused, Mindfulness,  coping skills, & other evidenced-based practices will be used to promote progress towards healthy functioning to help manage decrease symptoms associated with her bipolar  diagnosis. Patient will continue with regular biweekly therapy and treatment plan will be reviewed by 07/13/2024.   Phyllis Ginger MSW, LCSW DATE:07/14/2023

## 2023-07-16 DIAGNOSIS — F3181 Bipolar II disorder: Secondary | ICD-10-CM | POA: Insufficient documentation

## 2023-07-21 ENCOUNTER — Ambulatory Visit: Payer: No Typology Code available for payment source | Admitting: Licensed Clinical Social Worker

## 2023-07-21 DIAGNOSIS — F3181 Bipolar II disorder: Secondary | ICD-10-CM | POA: Diagnosis not present

## 2023-07-21 NOTE — Progress Notes (Signed)
 [facility] Counselor/Therapist Progress Note  Patient ID: Eileen Donovan, MRN: [id number]    Date: 07/21/23  Time Spent: 1101  am - 1202 pm : 61 Minutes  Treatment Type: Individual Therapy.  Reported Symptoms: Patient reports a difficult childhood. Patient reports recent diagnosis of ADHD. 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 She states that over the past several years she has experienced suicidal ideations.   Mental Status Exam: Appearance:  Casual     Behavior: Appropriate  Motor: Normal  Speech/Language:  Clear and Coherent  Affect: Appropriate  Mood: normal  Thought process: normal  Thought content:   WNL  Sensory/Perceptual disturbances:   WNL  Orientation: oriented to person, place, time/date, situation, day of week, month of year, and year  Attention: Good  Concentration: Good  Memory: WNL  Fund of knowledge:  Good  Insight:   Good  Judgment:  Good  Impulse Control: Good    Risk Assessment: Danger to Self:  No Self-injurious Behavior: No Danger to Others: No Duty to Warn:no Physical Aggression / Violence:No  Access to Firearms a concern: No  Gang Involvement:No   Eileen Donovan presented for her session via video from her home. [name] and consented to trreatment. Clinician presented for the session via video via her office located at the [facility].  Patient presented for her session reporting that she has been making changes in order to decrease her work stress. Patient identified that she has got her own computer to do her personal work on. She reports that she is also only doing work in her office and personal work on her computer in her living area by the window. She also states she has stopped working on emails and other work after work hours or before. Patient also reports that she has got some small puzzles and art supplies to help with keeping calm and having something to do. Patient reports  that she remains stressed about some legal issues at work and is always concerned that she will lose her job. She shared a situation where she shared some scientific information and they are trying to say she shared privileged information. Patient argues that it is scientific information and doesn't feel that the information is secret. Patient is very knowledgeable in her field and has difficulty relating to her work peers. Patient is also very detailed and reports that at times she feels she cares too much.  Clinician actively listened and provided understanding and validation through verbal interaction. Clinician processed with patient that a good work ethic is rare and that her perfectionism and detail to her work is something that others may find intimidating. Clinician and patient processed that is not a bad thing to be known for detailed work. Clinician praised patient for her proactive approach to her mental wellness. Clinician encouraged patient to continue to place emphasis on her self-care.  Patient acknowledged that she is feeling better and more positive and is feeling as though she has more clarity about her work and her life. Patient identified that the little changes she has made have made a big difference. Patient agrees to continue work on coping skills to assist with reaching her treatment goals.  Goals include, establishing a routine, practicing mindfulness, and seeking social support.  Routine Go to bed and wake up at the same time each day, Eat meals at consistent times, Exercise regularly, and Incorporate activities you enjoy.  Mindfulness  Practice meditation, yoga,  or stretching Try breathwork exercises like box breathing Practice mindfulness apps Social support Connect with friends and family who are supportive, Join a social support group, and Ask for help when needed.  Journaling  Track your mood, sleep, activities, and feelings Reflect on your emotional states and  relationships Medication  Take medication as directed Talk with your doctor before taking other medicines Don't skip or change your dose without talking with your doctor Other strategies  Limit stress Avoid substances that can trigger mood episodes, like alcohol and caffeine Make time for activities that bring you joy Learn from past episodes and recognize early warning signs. Patient will continue to engage in weekly therapy and treatment plan will be reviewed by 07/13/2024.    Cognitive Behavioral Therapy  Diagnosis: Bipolar II Disorder  Phyllis Ginger MSW, LCSW/DATE 07/21/2023

## 2023-07-22 ENCOUNTER — Other Ambulatory Visit: Payer: Self-pay

## 2023-07-22 ENCOUNTER — Other Ambulatory Visit (HOSPITAL_BASED_OUTPATIENT_CLINIC_OR_DEPARTMENT_OTHER): Payer: Self-pay

## 2023-07-27 ENCOUNTER — Ambulatory Visit: Admitting: Licensed Clinical Social Worker

## 2023-07-27 DIAGNOSIS — F3181 Bipolar II disorder: Secondary | ICD-10-CM | POA: Diagnosis not present

## 2023-07-27 NOTE — Progress Notes (Signed)
 [name]  Patient ID: Eileen Donovan, MRN: [id number]    Date: 07/27/23  Time Spent: 0202  pm - 0257 pm : 55 Minutes  Treatment Type: Individual Therapy.  Reported Symptoms: Patient reports a difficult childhood. Patient reports recent diagnosis of ADHD. 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 She states that over the past several years she has experienced suicidal ideations.    Mental Status Exam: Appearance:  Casual     Behavior: Appropriate  Motor: Normal  Speech/Language:  Clear and Coherent  Affect: Appropriate  Mood: normal  Thought process: normal  Thought content:   WNL  Sensory/Perceptual disturbances:   WNL  Orientation: oriented to person, place, time/date, situation, day of week, month of year, and year  Attention: Good  Concentration: Good  Memory: WNL  Fund of knowledge:  Good  Insight:   Good  Judgment:  Good  Impulse Control: Good    Risk Assessment: Danger to Self:  No Self-injurious Behavior: No Danger to Others: No Duty to Warn:no Physical Aggression / Violence:No  Access to Firearms a concern: No  Gang Involvement:No    Eileen Donovan presented for her session via video from her home. [name] and consented to trreatment. Clinician presented for the session via video via her office located at the [facility]   Eileen Donovan presented for her session in a positive mood. Patient reports that she continues to work on building a routine and not engage in work before and after hours. Patient states she continues to go to the gym and is wanting to continue to work toward a healthier self. Patient states she meets with the lawyer from her work this afternoon. She states that she is hoping to get some definitive answers and to be able to feel better about her work situation. Eileen Donovan also states she meets with the HR Director on Thursday and is again hoping to get resolve from  the issues she has been dealing with. Patient states that she continues to stress about her job but admits that she doesn't have any direct evidence that she could or would loser her job.   Clinician provided support and positive feedback through active listening and verbal interaction. Clinician encouraged patient to utilize coping skills to re frame her thoughts and assist her in thinking positively. Clinician encouraged patient to keep notes and records as patient stats she has already got many records and will advocate for herself should she be the fall guy for all of the data issues. Clinician encouraged patient to remain active and engage in exercise and and and community involvement to help her stay engaged socially.   Patient agrees to continue work on coping skills to assist with reaching her treatment goals.   Goals include, establishing a routine, practicing mindfulness, and seeking social support.  Routine Go to bed and wake up at the same time each day, Eat meals at consistent times, Exercise regularly, and Incorporate activities you enjoy.  Mindfulness  Practice meditation, yoga, or stretching Try breathwork exercises like box breathing Practice mindfulness apps Social support Connect with friends and family who are supportive, Join a social support group, and Ask for help when needed.  Journaling  Track your mood, sleep, activities, and feelings Reflect on your emotional states and relationships Medication  Take medication as directed Talk with your doctor before taking other medicines Don't skip or change your dose without talking with your doctor Other  strategies  Limit stress Avoid substances that can trigger mood episodes, like alcohol and caffeine Make time for activities that bring you joy Learn from past episodes and recognize early warning signs. Patient will continue to engage in weekly therapy and treatment plan will be reviewed by 07/13/2024.       Cognitive  Behavioral Therapy   Diagnosis: Bipolar II Disorder      Eileen Donovan MSW, LCSW/DATE 07/27/2023

## 2023-08-04 ENCOUNTER — Ambulatory Visit: Admitting: Licensed Clinical Social Worker

## 2023-08-11 ENCOUNTER — Ambulatory Visit: Admitting: Licensed Clinical Social Worker

## 2023-08-11 DIAGNOSIS — F3181 Bipolar II disorder: Secondary | ICD-10-CM | POA: Diagnosis not present

## 2023-08-11 NOTE — Progress Notes (Signed)
 [facility] Counselor/Therapist Progress Note  Eileen Donovan ID: Eileen Donovan, MRN: [id number]    Date: 08/11/23  Time Spent: 0800  am - 0900 am : 60 Minutes  Treatment Type: Individual Therapy.  Reported Symptoms: Eileen Donovan reports a difficult childhood. Eileen Donovan reports recent diagnosis of ADHD. Eileen Donovan reports that she has had 2 brain injuries. She states that over the past several years she has experienced suicidal ideations.    Mental Status Exam: Appearance:  Casual     Behavior: Appropriate  Motor: Normal  Speech/Language:  Clear and Coherent  Affect: Appropriate  Mood: normal  Thought process: normal  Thought content:   WNL  Sensory/Perceptual disturbances:   WNL  Orientation: oriented to person, place, time/date, situation, day of week, month of year, and year  Attention: Good  Concentration: Good  Memory: WNL  Fund of knowledge:  Good  Insight:   Good  Judgment:  Good  Impulse Control: Good    Risk Assessment: Danger to Self:  No Self-injurious Behavior: No Danger to Others: No Duty to Warn:no Physical Aggression / Violence:No  Access to Firearms a concern: No  Gang Involvement:No    Eileen Donovan presented for her session in person to the [employer] [facility] location. Clinician was present in person. Eileen Donovan consented to treatment.     Eileen Donovan presented for her session in a more positive mood than previously reported. Eileen Donovan states that she has spoken to the lawyer and HR at her current employment. Eileen Donovan states that she feels she has appropriate documentation and  doesn't stress so much about losing her job as previously. Eileen Donovan states that she is working and continue to cut her workday when her workday ends. She doesn't respond or read emails after hours. Eileen Donovan continues to have a designated work space as well as a Copywriter, advertising work Clinical biochemist. Eileen Donovan reports that she is going to the gym still and is considering the weight loss shot if her provider will write the  prescription. Puneet had a more positive outlook than previous sessions.  Clinician processed with Eileen Donovan her thoughts and concerns and validated her feelings and provided support, via verbal interaction and active listening. Clinician processed with Eileen Donovan the importance of personal boundaries and not allowing work to fully consume her life. Clinician processed with Eileen Donovan the importance and advantages of keeping work and personal time separate.   Eileen Donovan was engaged and motivated for treatment. Eileen Donovan is proactive and thinks outside the box to find solutions to her problems and works to effectively address them and make changes. Eileen Donovan will continue to keep weekly to bi weekly therapy sessions based on her need and symptoms. Treatment plan will be reviewed by 07/13/2024.  Goals include, establishing a routine, practicing mindfulness, and seeking social support.  Routine Go to bed and wake up at the same time each day, Eat meals at consistent times, Exercise regularly, and Incorporate activities you enjoy.  Mindfulness  Practice meditation, yoga, or stretching Try breathwork exercises like box breathing Practice mindfulness apps Social support Connect with friends and family who are supportive, Join a social support group, and Ask for help when needed.  Journaling  Track your mood, sleep, activities, and feelings Reflect on your emotional states and relationships Medication  Take medication as directed Talk with your doctor before taking other medicines Don't skip or change your dose without talking with your doctor Other strategies  Limit stress Avoid substances that can trigger mood episodes, like alcohol and caffeine Make time for activities that bring you  joy Learn from past episodes and recognize early warning signs.    Interventions: Cognitive Behavioral Therapy and Solution-Oriented/Positive Psychology  Diagnosis: Bipolar II Disorder    Phyllis Ginger MSW, LCSW/DATE 08/11/2023

## 2023-08-12 ENCOUNTER — Other Ambulatory Visit (HOSPITAL_BASED_OUTPATIENT_CLINIC_OR_DEPARTMENT_OTHER): Payer: Self-pay

## 2023-08-12 MED ORDER — PAROXETINE HCL 40 MG PO TABS
40.0000 mg | ORAL_TABLET | Freq: Every morning | ORAL | 1 refills | Status: DC
  Filled 2023-08-12: qty 30, 30d supply, fill #0

## 2023-08-12 MED ORDER — MELOXICAM 15 MG PO TABS
15.0000 mg | ORAL_TABLET | Freq: Every day | ORAL | 0 refills | Status: AC | PRN
  Filled 2023-08-12: qty 30, 30d supply, fill #0

## 2023-08-12 MED ORDER — PHENTERMINE HCL 37.5 MG PO CAPS
37.5000 mg | ORAL_CAPSULE | Freq: Every day | ORAL | 0 refills | Status: DC
  Filled 2023-08-12: qty 30, 30d supply, fill #0

## 2023-08-12 MED ORDER — ZOLPIDEM TARTRATE 5 MG PO TABS
5.0000 mg | ORAL_TABLET | Freq: Every day | ORAL | 0 refills | Status: DC
  Filled 2023-08-12: qty 30, 30d supply, fill #0

## 2023-08-18 ENCOUNTER — Ambulatory Visit: Admitting: Licensed Clinical Social Worker

## 2023-08-18 DIAGNOSIS — F3181 Bipolar II disorder: Secondary | ICD-10-CM | POA: Diagnosis not present

## 2023-08-18 NOTE — Progress Notes (Signed)
 Eileen Donovan  Patient ID: Eileen Donovan, MRN: [id number]    Date: 08/18/23  Time Spent: 0800  am - 0900 am : 60 Minutes  Treatment Type: Individual Therapy.  Reported Symptoms: Patient reports a difficult childhood. Patient reports recent diagnosis of ADHD. 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 She states that over the past several years she has experienced suicidal ideations.    Mental Status Exam: Appearance:  Casual     Behavior: Appropriate  Motor: Normal  Speech/Language:  Clear and Coherent  Affect: Appropriate  Mood: normal  Thought process: normal  Thought content:   WNL  Sensory/Perceptual disturbances:   WNL  Orientation: oriented to person, place, time/date, situation, day of week, month of year, and year  Attention: Good  Concentration: Good  Memory: WNL  Fund of knowledge:  Good  Insight:   Good  Judgment:  Good  Impulse Control: Good    Risk Assessment: Danger to Self:  No Self-injurious Behavior: No Danger to Others: No Duty to Warn:no Physical Aggression / Violence:No  Access to Firearms a concern: No  Gang Involvement:No    Eileen Donovan presented for her session in person to the [employer] [facility] location. Clinician was present in person. Patient consented to treatment.    Eileen Donovan presented for her session in a positive mood. Patient states she has been to the gym and is faithful going each day. Patient is teaching a cycling class and reports that during her lowest point in life this was her savior. Patient continued to provide updates on her work and the events that are happening. Although patient finds herself frustrated at her work she continues to practice healthy boundaries. Eileen Donovan continues to search for ideas to help her feel better about herself and how to make healthier choices regarding spending while in a manic phase.  Clinician provided support and encouragement via active listening and  verbal feedback. Clinician processed with patient her insight into the setting of work boundaries and processed her concerns over her spending while in a manic phase. Clinician processed coping skills and discussed that keeping a routine and staying on medication will assist her in remaining stable overall. Clinician processed the following with patient: limit access to funds, consider a cash-only system or having a trusted person manage finances, and seek professional help to develop healthy coping strategies.  Here's a more detailed breakdown of coping skills: Financial Strategies: Limit Access to Funds: Cash-Only System: Opt for a cash-only system to limit impulse purchases and spending.  Trusted Person: Consider giving credit cards to a trusted person or having them manage your finances during manic episodes.  Separate Accounts: Establish separate bank accounts for bills and personal expenses, with limited funds in the latter.  Automate Payments: Set up automatic bill payments to ensure you stay on top of due dates.  Debit Card Restrictions: If using a debit card, ensure it's set up to deny overdraft spending.  Financial Power of Attorney: Consider making a lasting power of attorney to formally appoint someone to help manage your finances if you become unwell again.  Debt Management: If you have existing debt, work with a Forensic scientist or advisor to develop a plan to manage it.  Mental Health Strategies: Recognize Triggers: Identify the triggers that lead to manic episodes and spending sprees.  Build a Scientist, clinical (histocompatibility and immunogenetics): Enlist the help of trusted friends, family, or partners to help you stay accountable during manic episodes.  Self-Management Plan: Develop  a self-management plan that includes self-care practices to help manage mood swings and prevent impulsive behaviors.  Maintain Routine: Try to maintain a stable daily schedule, including sleep, eating, and exercise patterns, as much as  possible.  Delay Big Decisions: Avoid making major life changes or decisions during a manic episode and seek advice from a mental health professional or trusted person.  Mindfulness and Meditation: Practice mindfulness and meditation to help manage racing thoughts and promote emotional balance.  Exercise: Engage in regular physical activity, which can have a calming effect and release mood-boosting chemicals.  Medication Management: Work with a Financial trader to manage your medication and ensure it is effective in stabilizing your mood.   Eileen Donovan has shown improvement in her Bipolar symptoms. Patient is eager to gain additional coping skills. Patient denied current or recent suicidal ideations. Patient reports that she is actively taking a role in her treatment and has an appointment with a Psychiatrist on 08/19/2023 for an evaluatation to determine if she is on the proper medication for her her ADHD and her Bipolar disorder. Eileen Donovan will continue with weekly CBT sessions and will continue to utilize coping skills to assist with her reaching her treatment goals. Treatment plan will be reviewed by 07/13/2024.  Interventions: Cognitive Behavioral Therapy and Solution-Oriented/Positive Psychology   Diagnosis: Bipolar II Disorder   Phyllis Ginger MSW, LCSW/DATE 08/18/2023

## 2023-08-19 ENCOUNTER — Other Ambulatory Visit (HOSPITAL_BASED_OUTPATIENT_CLINIC_OR_DEPARTMENT_OTHER): Payer: Self-pay

## 2023-08-19 MED ORDER — FLUOXETINE HCL 20 MG PO CAPS
20.0000 mg | ORAL_CAPSULE | Freq: Every day | ORAL | 2 refills | Status: DC
  Filled 2023-08-19: qty 30, 30d supply, fill #0

## 2023-08-19 MED ORDER — LAMOTRIGINE 25 MG PO TABS
25.0000 mg | ORAL_TABLET | Freq: Every day | ORAL | 1 refills | Status: DC
  Filled 2023-08-19: qty 42, 28d supply, fill #0
  Filled 2023-09-11: qty 42, 28d supply, fill #1

## 2023-08-24 ENCOUNTER — Ambulatory Visit: Admitting: Licensed Clinical Social Worker

## 2023-08-29 ENCOUNTER — Other Ambulatory Visit (HOSPITAL_BASED_OUTPATIENT_CLINIC_OR_DEPARTMENT_OTHER): Payer: Self-pay

## 2023-08-29 MED ORDER — FLUOXETINE HCL 40 MG PO CAPS
40.0000 mg | ORAL_CAPSULE | Freq: Every day | ORAL | 0 refills | Status: DC
  Filled 2023-08-29: qty 30, 30d supply, fill #0

## 2023-09-01 ENCOUNTER — Ambulatory Visit: Admitting: Licensed Clinical Social Worker

## 2023-09-08 ENCOUNTER — Ambulatory Visit: Admitting: Licensed Clinical Social Worker

## 2023-09-08 DIAGNOSIS — F3181 Bipolar II disorder: Secondary | ICD-10-CM | POA: Diagnosis not present

## 2023-09-08 NOTE — Progress Notes (Unsigned)
 [facility] Counselor/Therapist Progress Note  Patient ID: Eileen Donovan, MRN: [id number]    Date: 09/08/23  Time Spent: 0801  am - 0900 am : 59 Minutes  Treatment Type: Individual Therapy.  Reported Symptoms: Patient reports a difficult childhood. Patient reports recent diagnosis of ADHD. 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 She states that over the past several years she has experienced suicidal ideations.    Mental Status Exam: Appearance:  Casual     Behavior: Appropriate  Motor: Normal  Speech/Language:  Clear and Coherent  Affect: Appropriate  Mood: normal  Thought process: normal  Thought content:   WNL  Sensory/Perceptual disturbances:   WNL  Orientation: oriented to person, place, time/date, situation, day of week, month of year, and year  Attention: Good  Concentration: Good  Memory: WNL  Fund of knowledge:  Good  Insight:   Good  Judgment:  Good  Impulse Control: Good    Risk Assessment: Danger to Self:  No Self-injurious Behavior: No Danger to Others: No Duty to Warn:no Physical Aggression / Violence:No  Access to Firearms a concern: No  Gang Involvement:No    Eileen Donovan presented for her session in person to the [employer] location. Clinician was present in person. Patient consented to treatment.     Eileen Donovan presented for her session energetic and in a positive mood. Eileen Donovan shared that she is feeling much better about her job and her fear of being fired. She reports that she still feels that they aren't always focussed on detail and this causes her to feel overwhelmed. She admits that she is very focused on detail and pushes herself to do well. She reports that she worries about her work and feels that it is a Engineer, mining of herself.  Clinician actively listened and provided encouragement and support via active listening and verbal interaction. Clinician processed with patient the idea of her pushing herself so hard and taking  great pride in her work being an effect from her childhood. Patient agreed that she never felt good enough as a child and that she was the one left out and not a part of her mothers focus. She states that she now has made her own way and tries to not allow those hurtful things impact her. Although patient acknowledges that it is likely the hurt from childhood that drives her to work so hard.   Eileen Donovan was fully engaged in discussion and very insightful. 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 Patient is proactive in her treatment and states that she is in a good place. Eileen Donovan denied SI/HI and reports that she is feeling stable at this point. Patient was informed of Clinicians absence over the next couple of weeks and chose to wait till Clinician returns. Eileen Donovan was urged to call the office should she need assistance or 911 if in Crisis. Patient is encouraged to practice the following skills: Set Boundaries: Define specific work hours and stick to them, even if it means saying "no" to extra commitments.  Prioritize and Manage Time: Use time management techniques to focus on the most important tasks and delegate less critical ones.  Prioritize Health: Ensure sufficient sleep, exercise, and a healthy diet to maintain physical and mental well-being.  Take Breaks: Incorporate regular breaks throughout the workday to avoid burnout and improve focus.  Seek Support: Don't hesitate to ask for help  from colleagues or supervisors when workload becomes overwhelming.  Practice Self-Care: Engage in activities that promote relaxation and stress reduction, such as meditation, hobbies, or spending time with loved ones.  Promote Work-Life Balance: Armed forces technical officer and supervisors to prioritize work-life balance and avoid glorifying overwork.  Set Realistic Goals: Ensure that goals are achievable and don't lead to  unrealistic expectations or a constant feeling of being behind.  Evaluate Progress: Regularly assess your workload and identify areas where you can improve time management or delegate tasks.  Patient will continue with bi weekly therapy and treatment plan will be reviewed by 07/13/2024  Interventions: Cognitive Behavioral Therapy and Assertiveness/Communication  Diagnosis: Bipolar II Disorder    [name] MSW, LCSW/DATE 09/08/2023

## 2023-09-13 ENCOUNTER — Other Ambulatory Visit (HOSPITAL_BASED_OUTPATIENT_CLINIC_OR_DEPARTMENT_OTHER): Payer: Self-pay

## 2023-09-14 ENCOUNTER — Other Ambulatory Visit (HOSPITAL_BASED_OUTPATIENT_CLINIC_OR_DEPARTMENT_OTHER): Payer: Self-pay

## 2023-09-15 ENCOUNTER — Ambulatory Visit: Admitting: Licensed Clinical Social Worker

## 2023-09-21 ENCOUNTER — Other Ambulatory Visit (HOSPITAL_COMMUNITY): Payer: Self-pay

## 2023-09-22 ENCOUNTER — Other Ambulatory Visit: Payer: Self-pay

## 2023-09-23 ENCOUNTER — Other Ambulatory Visit: Payer: Self-pay

## 2023-09-23 ENCOUNTER — Other Ambulatory Visit (HOSPITAL_BASED_OUTPATIENT_CLINIC_OR_DEPARTMENT_OTHER): Payer: Self-pay

## 2023-09-23 MED ORDER — FLUOXETINE HCL 60 MG PO TABS
1.0000 | ORAL_TABLET | Freq: Every day | ORAL | 2 refills | Status: DC
  Filled 2023-09-23: qty 30, 30d supply, fill #0

## 2023-09-23 MED ORDER — LAMOTRIGINE 100 MG PO TABS
100.0000 mg | ORAL_TABLET | Freq: Every day | ORAL | 2 refills | Status: DC
  Filled 2023-09-23: qty 30, 30d supply, fill #0
  Filled 2023-10-05 – 2023-10-17 (×2): qty 30, 30d supply, fill #1

## 2023-09-26 ENCOUNTER — Other Ambulatory Visit: Payer: Self-pay

## 2023-10-03 ENCOUNTER — Other Ambulatory Visit (HOSPITAL_BASED_OUTPATIENT_CLINIC_OR_DEPARTMENT_OTHER): Payer: Self-pay

## 2023-10-03 ENCOUNTER — Other Ambulatory Visit: Payer: Self-pay

## 2023-10-03 MED ORDER — LAMOTRIGINE 100 MG PO TABS
100.0000 mg | ORAL_TABLET | Freq: Every morning | ORAL | 0 refills | Status: DC
  Filled 2023-10-03 – 2024-02-16 (×3): qty 90, 90d supply, fill #0

## 2023-10-03 MED ORDER — PAROXETINE HCL 40 MG PO TABS
40.0000 mg | ORAL_TABLET | Freq: Every day | ORAL | 0 refills | Status: DC
  Filled 2023-10-03: qty 90, 90d supply, fill #0

## 2023-10-05 ENCOUNTER — Other Ambulatory Visit (HOSPITAL_BASED_OUTPATIENT_CLINIC_OR_DEPARTMENT_OTHER): Payer: Self-pay

## 2023-10-05 ENCOUNTER — Ambulatory Visit (INDEPENDENT_AMBULATORY_CARE_PROVIDER_SITE_OTHER): Admitting: Licensed Clinical Social Worker

## 2023-10-05 DIAGNOSIS — F3181 Bipolar II disorder: Secondary | ICD-10-CM | POA: Diagnosis not present

## 2023-10-05 NOTE — Progress Notes (Signed)
 [facility] Counselor/Therapist Progress Note  Patient ID: Eileen Donovan, MRN: [id number]    Date: 10/05/23  Time Spent: 801  am - 0900 am : 59 Minutes  Treatment Type: Individual Therapy.  Reported Symptoms: Patient reports a difficult childhood. Patient reports recent diagnosis of ADHD. 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 She states that over the past several years she has experienced suicidal ideations.    Mental Status Exam: Appearance:  Casual     Behavior: Appropriate  Motor: Normal  Speech/Language:  Clear and Coherent  Affect: Appropriate  Mood: normal  Thought process: normal  Thought content:   WNL  Sensory/Perceptual disturbances:   WNL  Orientation: oriented to person, place, time/date, situation, day of week, month of year, and year  Attention: Good  Concentration: Good  Memory: WNL  Fund of knowledge:  Good  Insight:   Good  Judgment:  Good  Impulse Control: Good    Risk Assessment: Danger to Self:  No Self-injurious Behavior: No Danger to Others: No Duty to Warn:no Physical Aggression / Violence:No  Access to Firearms a concern: No  Gang Involvement:No    Eileen Donovan presented for her session in person to the [employer] location. Clinician was present in person. Patient consented to treatment.    Eileen Donovan presented for her session reporting some recent issues with tearfulness related to medication changes. Eileen Donovan reports that she has recently switched Psychiatric providers and her medications were changed. She reports that she is feeling better but lack motivation in the evenings. She reports that she has fallen behind on her household duties. Eileen Donovan reports that she isn't sure if it's depression or her ADHD. Patient reports that she feels her new psychiatrist is a better fit but isn't in her network, but she states she is willing to pay out of pocket. Patient also discussed some of her issues with her self-esteem and how her  perfectionism with her work is likely from her feelings of inadequacy in her childhood and teen years. Patient states that she can identify areas that her childhood negatively affects her. Patient inquired about EMDR.   Clinician provided support and feedback via active listening and verbal interaction. Clinician processed with patient her concerns and identified patients insightfulness into her situation. Clinician stated she would inquire about EMDR services and provide the information to patient once she finds out if a therapist is available.   Patient remains focused on her treatment and is diligent in seeking treatment and engaging. Patient reports improvement in the tearfulness since the medication change and states she is working toward getting back to completing task at home and continues to set limits with work hours. Patient will continue to engage in CBT therapy on a bi weekly basis. Treatment plan will be reviewed by 07/13/2024.  Motivational Interviewing and psycho education. Clinician conducted session in person at clinician's office at [facility]. Reviewed events since last session. Assessed patient's mood since last session and current mood. Clinician reviewed diagnoses and treatment recommendations. Provided psycho education related to diagnoses and treatment.     Diagnosis: Bipolar II   [name] MSW, LCSW/DATE 10/05/2023

## 2023-10-13 ENCOUNTER — Ambulatory Visit: Admitting: Licensed Clinical Social Worker

## 2023-10-13 DIAGNOSIS — F3181 Bipolar II disorder: Secondary | ICD-10-CM

## 2023-10-13 NOTE — Progress Notes (Addendum)
 [facility] Counselor/Therapist Progress Note  Patient ID: Eileen Donovan, MRN: [id number]    Date: 10/13/23  Time Spent: 802  am - 0807 am : 65 Minutes  Treatment Type: Individual Therapy.  Reported Symptoms: Patient reports a difficult childhood. Patient reports recent diagnosis of ADHD. 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 She states that over the past several years she has experienced suicidal ideations.    Mental Status Exam: Appearance:  Casual     Behavior: Appropriate  Motor: Normal  Speech/Language:  Clear and Coherent  Affect: Appropriate  Mood: normal  Thought process: normal  Thought content:   WNL  Sensory/Perceptual disturbances:   WNL  Orientation: oriented to person, place, time/date, situation, day of week, month of year, and year  Attention: Good  Concentration: Good  Memory: WNL  Fund of knowledge:  Good  Insight:   Good  Judgment:  Good  Impulse Control: Good    Risk Assessment: Danger to Self:  No Self-injurious Behavior: No Danger to Others: No Duty to Warn:no Physical Aggression / Violence:No  Access to Firearms a concern: No  Gang Involvement:No    Eileen Donovan presented for her session in person to the [employer] location. Clinician was present in person. Patient consented to treatment.     Eileen Donovan presented for her session in a positive mood. Lotta reports that she has been looking for ways to save money and improve her spending habits. Eileen Donovan shared that she has cut things such as personal training and going to the [employer] and buying and then wasting food. Patient reports that she has worked on getting past the fear/anxiety of being fired and is getting more confident that she has value within her company. Eileen Donovan shared some details about projects she is working on for work and her frustration at how others in quality are taking the responsibility for her work. She shared how frustrating it is and how the higher  ups could care less as  long as its done. Eileen Donovan states that she has been working to get a routine at home going during her work day that she can take breaks and complete house chores so she isn't overwhelmed with housework.  Clinician provided support and encouragement via active listening and verbal feedback. Clinician processed with patient her goals of improving her spending and budget. Clinician and patient also discussed ways to complete task during the work day while on breaks at the house to prevent being overwhelmed later in the day or week. Clinician processed with patient the benefit of a scheduled routine to assist in completing task and help with focus.  Routine building tips: Identify Goals and Needs: Reflect on what you want to achieve and what areas of your life need more structure. This could be related to productivity, health, or personal growth.  2. Create a List of Activities: Make a list of all daily tasks, including those for mornings, afternoons, and evenings. Consider the time required for each activity.  3. Set Small Goals: Break down larger goals into smaller, achievable steps. This makes the overall goal less daunting and easier to manage.  4. Determine Timing: Decide on a schedule for your routine, including when you want to start and end activities. Be realistic about the time required for each task.  5. Start Small: Begin with a few core routines and gradually add more as you become comfortable. Consistency is key, so start with a few small changes rather than trying  to overhaul everything at once.  6. Be Consistent with Timing: Aim to stick to your schedule as closely as possible, especially for the initial routines. This helps establish the new habit.  7. Pair Habits: Consider pairing a new habit with an existing one to create a built-in trigger. For example, journaling after brushing your teeth.  8. Make it Fun: Incorporate enjoyable activities into your routine to  make it more sustainable and enjoyable. Listen to an audiobook while doing housework, for example.  9. Be Prepared: Ensure you have all the necessary materials and resources before starting a routine. For example, have your gym clothes ready for a morning workout.  10. Track Progress and Reward: Monitor your progress and celebrate small victories. Reward yourself for sticking to the routine, which can help maintain motivation.  11. Adjust When Needed: Be flexible and willing to adjust your routine as needed. If something isn't working, don't hesitate to modify it.  12. Focus on Consistency: The more consistent you are, the more likely you are to establish the new routine as a habit.  Eileen Donovan proves to be very insightful and is quick to be proactive about fixing issues when she sees a problem. Although she reports that she tends to be impulsive when she is manic, she quickly becomes aware and is capable of making rational decisions to improve the situation. Eileen Donovan continues to be motivated for treatment and continues to have an increase in positive mood. Eileen Donovan will continue with bi weekly therapy sessions. Treatment plan to be reviewed by 07/13/2024.  Interventions: CBT, Motivational Interviewing and psycho education. Clinician conducted session in person at clinician's office at [facility]. Reviewed events since last session. Assessed patient's mood since last session and current mood. Clinician reviewed diagnoses and treatment recommendations. Provided psycho education related to diagnoses and treatment.    Diagnosis: Bipolar II  [name] MSW, LCSW/DATE 10/13/2023

## 2023-10-17 ENCOUNTER — Other Ambulatory Visit: Payer: Self-pay

## 2023-10-20 ENCOUNTER — Ambulatory Visit: Admitting: Licensed Clinical Social Worker

## 2023-10-20 DIAGNOSIS — F3181 Bipolar II disorder: Secondary | ICD-10-CM | POA: Diagnosis not present

## 2023-10-21 NOTE — Progress Notes (Addendum)
  [facility] Counselor/Therapist Progress Note  Patient ID: Eileen Donovan, MRN: [id number]    Date: 10/20/2023  Time Spent: 0802  am - 0900 am : 58 Minutes  Treatment Type: Individual Therapy.  Reported Symptoms: Patient reports a difficult childhood. Patient reports recent diagnosis of ADHD. 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 She states that over the past several years she has experienced suicidal ideations.    Mental Status Exam: Appearance:  Casual     Behavior: Appropriate  Motor: Normal  Speech/Language:  Clear and Coherent  Affect: Appropriate  Mood: normal  Thought process: normal  Thought content:   WNL  Sensory/Perceptual disturbances:   WNL  Orientation: oriented to person, place, time/date, situation, day of week, month of year, and year  Attention: Good  Concentration: Good  Memory: WNL  Fund of knowledge:  Good  Insight:   Good  Judgment:  Good  Impulse Control: Good    Risk Assessment: Danger to Self:  No Self-injurious Behavior: No Danger to Others: No Duty to Warn:no Physical Aggression / Violence:No  Access to Firearms a concern: No  Gang Involvement:No    Eileen Donovan presented for her session in person to the [employer] [facility] location. Clinician was present in person. Patient consented to treatment.    Eileen Donovan presented for her session eager to share the details of the past weeks. Eileen Donovan shared how she has purchased a new timer to assist her with staying on task. She states she has also placed in effect 15 minute breaks that allow her to get things done during the day so she doesn't feel overwhelmed in the evening. Eileen Donovan shared that she has also found additional ways to get household task completed without feeling overwhelmed when her work day is finished. Eileen Donovan states she continues to go to the gym and focus on her health and mental well being.   Clinician actively listened as patient shared her progress and clinician provided  positive feedback as a form of encouragement for patient. Clinician encouraged patient to make list but patient states she is not good at list but does do sticky notes and will throw them away when the task is complete. Clinician processed with patient additional ways to assist with completing and staying on task.  Eileen Donovan continues to make progress and is very proactive when she identifies an issue. Eileen Donovan is detail oriented and this proves to be both a blessing and curse. We processed that this has been something she has carried from childhood. Clinician encouraged patient to be the best she can be without feeling the need to compare herself to others. Eileen Donovan is very driven and ambitious which makes working with her a pleasure. Eileen Donovan will continue with bi weekly therapy sessions and treatment plan will be reviewed by 07/13/2024. Clinician utilized Engineer, manufacturing systems Therapy, Motivational Interviewing and psycho education. Clinician conducted session in person at clinician's office at [facility]. Reviewed events since last session. Assessed patient's mood since last session and current mood. Clinician reviewed diagnoses and treatment recommendations. Provided psycho education related to diagnoses and treatment.    Diagnosis: Bipolar II Disorder   [name] MSW, LCSW/DATE 10/20/2023

## 2023-10-26 ENCOUNTER — Ambulatory Visit: Admitting: Licensed Clinical Social Worker

## 2023-10-26 ENCOUNTER — Other Ambulatory Visit: Payer: Self-pay | Admitting: Obstetrics and Gynecology

## 2023-10-26 DIAGNOSIS — F3181 Bipolar II disorder: Secondary | ICD-10-CM | POA: Diagnosis not present

## 2023-10-26 DIAGNOSIS — Z1231 Encounter for screening mammogram for malignant neoplasm of breast: Secondary | ICD-10-CM

## 2023-10-26 NOTE — Progress Notes (Addendum)
 [facility] Counselor/Therapist Progress Note  Patient ID: Eileen Donovan, MRN: [id number]    Date: 10/26/23  Time Spent: 0800  am - 0901 am : 61 Minutes  Treatment Type: Individual Therapy.  Reported Symptoms: Patient reports a difficult childhood. Patient reports recent diagnosis of ADHD. 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 She states that over the past several years she has experienced suicidal ideations.    Mental Status Exam: Appearance:  Casual     Behavior: Appropriate  Motor: Normal  Speech/Language:  Clear and Coherent  Affect: Appropriate  Mood: normal  Thought process: normal  Thought content:   WNL  Sensory/Perceptual disturbances:   WNL  Orientation: oriented to person, place, time/date, situation, day of week, month of year, and year  Attention: Good  Concentration: Good  Memory: WNL  Fund of knowledge:  Good  Insight:   Good  Judgment:  Good  Impulse Control: Good    Risk Assessment: Danger to Self:  No Self-injurious Behavior: No Danger to Others: No Duty to Warn:no Physical Aggression / Violence:No  Access to Firearms a concern: No  Gang Involvement:No    Kalia Larita Pluck presented for her session in person to the [employer] [facility] location. Clinician was present in person. Patient consented to treatment.     Clinician utilized Engineer, manufacturing systems Therapy, Motivational Interviewing and psycho education. Clinician conducted session in person at clinician's office at [facility]. Reviewed events since last session. Assessed patient's mood since last session and current mood. Clinician reviewed diagnoses and treatment recommendations. Provided psycho education related to diagnoses and treatment.    Lanasia presented for her session after her morning gym routine. Alfreda was energetic and focussed on her session. Minerva reports that she continues to use her pomodoro timer to assist her with staying on task. Patient states that she is  keeping up with responsibilities. Deshara reports that work is going better. 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 She states she will keep records to protect herself but no longer stress herself by completing his work. Patient recognizes that excess stress from wanting to be so precise while doing others job is not necessary. Patient identified that by focusing on her responsibilities and doing her work effectively is her only responsibility.   Clinician processed with patient her good insight and how she is always proactive when a problem arises. Clinician processed with patient while it may be frustrating to see others not doing their job, it isn't her job to complete it for them. We discussed that eventually the higher ups will figure it out and will need to make the proper adjustments. Clinician encouraged patient to remind herself of the need to lessen her stress and not dwell on the issues at work while not working.  Brittne is fully engaged during her therapy sessions and is always pleasant and cooperative. Veralyn will continue to engage in weekly therapy sessions. Treatment plan will be reviewed by 07/13/2024.  Diagnosis: Bipolar II Disorder   [name] MSW, LCSW/DATE 10/26/2023

## 2023-10-28 ENCOUNTER — Encounter

## 2023-11-03 ENCOUNTER — Ambulatory Visit (INDEPENDENT_AMBULATORY_CARE_PROVIDER_SITE_OTHER): Admitting: Licensed Clinical Social Worker

## 2023-11-03 DIAGNOSIS — Z8659 Personal history of other mental and behavioral disorders: Secondary | ICD-10-CM

## 2023-11-03 DIAGNOSIS — F3181 Bipolar II disorder: Secondary | ICD-10-CM | POA: Diagnosis not present

## 2023-11-07 NOTE — Progress Notes (Signed)
 [facility] Counselor/Therapist Progress Note  Patient ID: MAR ZETTLER, MRN: [id number]    Date: 11/03/2023  Time Spent: 0800  am - 0903 am : 63 Minutes  Treatment Type: Individual Therapy.  Reported Symptoms: Patient reports a difficult childhood. Patient reports recent diagnosis of ADHD. 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 She states that over the past several years she has experienced suicidal ideations.    Mental Status Exam: Appearance:  Casual     Behavior: Appropriate  Motor: Normal  Speech/Language:  Clear and Coherent  Affect: Appropriate  Mood: normal  Thought process: normal  Thought content:   WNL  Sensory/Perceptual disturbances:   WNL  Orientation: oriented to person, place, time/date, situation, day of week, month of year, and year  Attention: Good  Concentration: Good  Memory: WNL  Fund of knowledge:  Good  Insight:   Good  Judgment:  Good  Impulse Control: Good    Risk Assessment: Danger to Self:  No Self-injurious Behavior: No Danger to Others: No Duty to Warn:no Physical Aggression / Violence:No  Access to Firearms a concern: No  Gang Involvement:No    Lluvia MARLA Holm presented for her session in person to the [employer] location. Clinician was present in person. Patient consented to treatment.      Clinician utilized Engineer, manufacturing systems Therapy, Motivational Interviewing and psycho education. Clinician conducted session in person at clinician's office at [facility]. Reviewed events since last session. Assessed patient's mood since last session and current mood. Clinician reviewed diagnoses and treatment recommendations. Provided psycho education related to diagnoses and treatment.    Takoya presented on time for her scheduled session. Alizay continues to be frustrated by her work. Marylon reports that not much has changed at work but she is standing her ground and not completing work that is not hers to complete.  Kristianna reports the quality guy is her source of frustration and how he manages to get by without doing his job. She reports that now she refuses to do it anymore she is anxious to see if he gets found out. Mabel does continue to remain productive and engaged in her work at [employer] and her cycling class. She keeps a routine which is helpful to her and her symptoms of ADHD. Defne reports that she utilizes her timers to assist with time management.  Clinician actively listened and engaged in discussion with patient. Clinician provided encouragement and assisted patient in identifying areas to increase time management and to decrease her work stress. Clinician and patient processed ideas for her remaining stress free at work and at home with task completion. We discussed how maintaining a routine and good sleep schedule can be helpful. Patient will continue to engage in weekly CBT therapy sessions. Treatment planning with be completed by 07/13/2024.  Interventions: Cognitive Behavioral Therapy, Dialectical Behavioral Therapy, Assertiveness/Communication, and Motivational Interviewing  Diagnosis: Bipolar II, hx of ADHD    Damien Junk MSW, LCSW/DATE 11/03/2023

## 2023-11-10 ENCOUNTER — Ambulatory Visit: Admitting: Licensed Clinical Social Worker

## 2023-11-17 ENCOUNTER — Ambulatory Visit: Admitting: Licensed Clinical Social Worker

## 2023-11-21 ENCOUNTER — Other Ambulatory Visit (HOSPITAL_BASED_OUTPATIENT_CLINIC_OR_DEPARTMENT_OTHER): Payer: Self-pay

## 2023-11-21 ENCOUNTER — Other Ambulatory Visit: Payer: Self-pay

## 2023-11-21 MED ORDER — BUPROPION HCL ER (XL) 150 MG PO TB24
150.0000 mg | ORAL_TABLET | Freq: Every day | ORAL | 0 refills | Status: DC
  Filled 2023-11-21: qty 90, 90d supply, fill #0

## 2023-11-21 MED ORDER — LAMOTRIGINE 100 MG PO TABS
100.0000 mg | ORAL_TABLET | Freq: Every morning | ORAL | 0 refills | Status: DC
  Filled 2023-11-21: qty 90, 90d supply, fill #0

## 2023-11-21 MED ORDER — PAROXETINE HCL 40 MG PO TABS
40.0000 mg | ORAL_TABLET | Freq: Every day | ORAL | 0 refills | Status: DC
  Filled 2023-11-21 – 2023-12-30 (×2): qty 90, 90d supply, fill #0

## 2023-11-23 ENCOUNTER — Ambulatory Visit (INDEPENDENT_AMBULATORY_CARE_PROVIDER_SITE_OTHER): Admitting: Licensed Clinical Social Worker

## 2023-11-23 DIAGNOSIS — F3181 Bipolar II disorder: Secondary | ICD-10-CM

## 2023-11-23 NOTE — Progress Notes (Signed)
 [facility] Counselor/Therapist Progress Note  Patient ID: Eileen Donovan, MRN: [id number]    Date: 11/23/23  Time Spent: 0800  am - 0901 am : 61 Minutes  Treatment Type: Individual Therapy.  Reported Symptoms: Patient reports a difficult childhood. Patient reports recent diagnosis of ADHD. 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 She states that over the past several years she has experienced suicidal ideations.    Mental Status Exam: Appearance:  Casual     Behavior: Appropriate  Motor: Normal  Speech/Language:  Clear and Coherent  Affect: Appropriate  Mood: normal  Thought process: normal  Thought content:   WNL  Sensory/Perceptual disturbances:   WNL  Orientation: oriented to person, place, time/date, situation, day of week, month of year, and year  Attention: Good  Concentration: Good  Memory: WNL  Fund of knowledge:  Good  Insight:   Good  Judgment:  Good  Impulse Control: Good    Risk Assessment: Danger to Self:  No Self-injurious Behavior: No Danger to Others: No Duty to Warn:no Physical Aggression / Violence:No  Access to Firearms a concern: No  Gang Involvement:No    Eileen Donovan presented for her session in person to the [employer] location. Clinician was present in person. Patient consented to treatment.      Clinician utilized Engineer, manufacturing systems Therapy, Motivational Interviewing and psycho education. Clinician conducted session in person at clinician's office at [facility]. Reviewed events since last session. Assessed patient's mood since last session and current mood. Clinician reviewed diagnoses and treatment recommendations. Provided psycho education related to diagnoses and treatment.    Eileen Donovan presented for her session in a positive mood. Eileen Donovan just had her workout at the gym and reports that she is doing well. Eileen Donovan reported she enjoyed the soccer game she was able to attend in [place]. 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 Patient reports that she will continue to cover herself by sending emails and addressing issues while documenting them. Patient reports that she is doing well with time management and understanding that at times she may need to take breaks and allow herself time to rest and not feel badly about it.  Clinician actively listened and processed with patient the importance of covering herself through documentation, while recognizing she is not responsible for doing the job of quality. Clinician encouraged patient to remind herself of how the negatively impacts her and the importance of not allowing herself to get overwhelmed with others duties.   Eileen Donovan was polite and cooperative and fully engaged in session. Eileen Donovan will continue to utilize coping skills to improve mental health symptoms. Eileen Donovan will continue to engage in weekly therapy sessions. Treatment planning will be reviewed by 07/13/2024. Goals include, establishing a routine, practicing mindfulness, and seeking social support.  Routine Go to bed and wake up at the same time each day, Eat meals at consistent times, Exercise regularly, and Incorporate activities you enjoy.  Mindfulness  Practice meditation, yoga, or stretching Try breathwork exercises like box breathing Practice mindfulness apps Social support Connect with friends and family who are supportive, Join a social support group, and Ask for help when needed.  Journaling  Track your mood, sleep, activities, and feelings Reflect on your emotional states and relationships Medication  Take medication as directed Talk with your doctor before taking other medicines Don't skip or change your dose without talking with  your doctor Other strategies  Limit stress Avoid substances that can trigger mood episodes, like alcohol and caffeine Make time for activities that bring you joy Learn  from past episodes and recognize early warning signs.       Cognitive Behavioral Therapy   Diagnosis: Bipolar II Disorder    Diagnosis: Bipolar II Disorder   Damien Junk MSW, LCSW/DATE 11/23/2023

## 2023-12-01 ENCOUNTER — Ambulatory Visit: Admitting: Licensed Clinical Social Worker

## 2023-12-01 DIAGNOSIS — F3181 Bipolar II disorder: Secondary | ICD-10-CM

## 2023-12-02 NOTE — Progress Notes (Signed)
 [facility] Counselor/Therapist Progress Note  Patient ID: KENDY HASTON, MRN: [id number]    Date: 12/02/23  Time Spent: 0800  am - 0900 am : 60 Minutes  Treatment Type: Individual Therapy.  Reported Symptoms: Patient reports a difficult childhood. Patient reports recent diagnosis of ADHD. 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 She states that over the past several years she has experienced suicidal ideations.    Mental Status Exam: Appearance:  Casual     Behavior: Appropriate  Motor: Normal  Speech/Language:  Clear and Coherent  Affect: Appropriate  Mood: normal  Thought process: normal  Thought content:   WNL  Sensory/Perceptual disturbances:   WNL  Orientation: oriented to person, place, time/date, situation, day of week, month of year, and year  Attention: Good  Concentration: Good  Memory: WNL  Fund of knowledge:  Good  Insight:   Good  Judgment:  Good  Impulse Control: Good    Risk Assessment: Danger to Self:  No Self-injurious Behavior: No Danger to Others: No Duty to Warn:no Physical Aggression / Violence:No  Access to Firearms a concern: No  Gang Involvement:No    Jaxon MARLA Holm presented for her session in person to the [facility] location. Clinician was present in person. Patient consented to treatment.     Deberah presented for her session being energetic and positive. Kyler states that she is continuing with her daily gym routine. Patient states that she continues to keep boundaries at work but patient also continues to remain hyper focussed on her job and the lack of competence with many of her co workers. Although Fujiko is frustrated she has been able to improve her outlook and not feel as though she is responsible for fixing everyones mistakes.   Clinician actively listened and provided support via verbal interaction with patient. Clinician processed with patient that her childhood and feelings of inadequacy could be playing a  role in her perfectionism at work and fear of failure.  Shastina was pleasant and cooperative and fully engaged in session. Patient is motivated for treatment and desire to improve her overall quality of life. Mckensi will continue to utilize coping skills to decrease her mental health symptoms. Brienna will continue to engage in weekly therapy sessions. Treatment planning will be reviewed by 07/26/2024.   Interventions: Cognitive Behavioral Therapy, Motivational Interviewing and psycho education. Clinician conducted session in person at clinician's office at [facility]. Reviewed events since last session. Assessed patient's mood since last session and current mood. Clinician reviewed diagnoses and treatment recommendations. Provided psycho education related to diagnoses and treatment.     Diagnosis: Bipolar II Disorder    Damien Junk MSW, LCSW/DATE 12/01/2023

## 2023-12-08 ENCOUNTER — Ambulatory Visit: Admitting: Licensed Clinical Social Worker

## 2023-12-08 DIAGNOSIS — F3181 Bipolar II disorder: Secondary | ICD-10-CM

## 2023-12-08 NOTE — Progress Notes (Addendum)
 [facility] Counselor/Therapist Progress Note  Patient ID: Eileen Donovan, MRN: [id number]    Date: 12/08/23  Time Spent: 0800  am - 0900 am : 60 Minutes  Treatment Type: Individual Therapy.  Reported Symptoms: Patient reports a difficult childhood. Patient reports recent diagnosis of ADHD. 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 She states that over the past several years she has experienced suicidal ideations.    Mental Status Exam: Appearance:  Casual     Behavior: Appropriate  Motor: Normal  Speech/Language:  Clear and Coherent  Affect: Appropriate  Mood: normal  Thought process: normal  Thought content:   WNL  Sensory/Perceptual disturbances:   WNL  Orientation: oriented to person, place, time/date, situation, day of week, month of year, and year  Attention: Good  Concentration: Good  Memory: WNL  Fund of knowledge:  Good  Insight:   Good  Judgment:  Good  Impulse Control: Good    Risk Assessment: Danger to Self:  No Self-injurious Behavior: No Danger to Others: No Duty to Warn:no Physical Aggression / Violence:No  Access to Firearms a concern: No  Gang Involvement:No    Eileen Donovan presented for her session in person to the [facility] location. Clinician was present in person. Patient consented to treatment.     Eileen Donovan presented for her session full of energy and eager to share her progress. Eileen Donovan attended a gym class this morning and is faithful to go to the gym on a daily basis. 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  Eileen Donovan reports that she is continuing to keep a schedule and take care of things in her home. She reports that she is keeping her boundaries at work and focussing on the positive things in her life.  Clinician actively listened and provided encouragement and support via verbal feedback. Clinician processed  with patient her goals and how she has remained stable over the past few months by keeping a routine and being active and social. Clinician and patient processed that being active and going to gym has improved her ability to cope with the stress at work.  Eileen Donovan was fully engaged and motivated for treatment. Eileen Donovan will continue to utilize coping skills to assist in maintaining overall wellness. Eileen Donovan will continue to engage in bi weekly therapy sessions. Treament planning to be reviewed by 08/10/2024.   Interventions: Cognitive Behavioral Therapy, Dialectical Behavioral Therapy, Assertiveness/Communication, Motivational Interviewing, and Solution-Oriented/Positive Psychology  Diagnosis: Bipolar II    Damien Junk MSW, LCSW/DATE 12/08/2023

## 2023-12-14 ENCOUNTER — Ambulatory Visit
Admission: RE | Admit: 2023-12-14 | Discharge: 2023-12-14 | Disposition: A | Discharge status: Home / Self Care | Source: Ambulatory Visit | Attending: Obstetrics and Gynecology | Admitting: Obstetrics and Gynecology

## 2023-12-14 DIAGNOSIS — Z1231 Encounter for screening mammogram for malignant neoplasm of breast: Secondary | ICD-10-CM

## 2023-12-15 ENCOUNTER — Ambulatory Visit: Admitting: Licensed Clinical Social Worker

## 2023-12-18 ENCOUNTER — Ambulatory Visit: Payer: Self-pay | Admitting: Obstetrics and Gynecology

## 2023-12-22 ENCOUNTER — Ambulatory Visit (INDEPENDENT_AMBULATORY_CARE_PROVIDER_SITE_OTHER): Admitting: Licensed Clinical Social Worker

## 2023-12-22 DIAGNOSIS — F3181 Bipolar II disorder: Secondary | ICD-10-CM | POA: Diagnosis not present

## 2023-12-22 NOTE — Progress Notes (Signed)
 [facility] Counselor/Therapist Progress Note  Patient ID: Eileen Donovan, MRN: [id number]    Date: 12/22/23  Time Spent: 0800  am - 0858 am : 58 Minutes  Treatment Type: Individual Therapy.  Reported Symptoms: Patient reports a difficult childhood. Patient reports recent diagnosis of ADHD. 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 She states that over the past several years she has experienced suicidal ideations.    Mental Status Exam: Appearance:  Casual     Behavior: Appropriate  Motor: Normal  Speech/Language:  Clear and Coherent  Affect: Appropriate  Mood: normal  Thought process: normal  Thought content:   WNL  Sensory/Perceptual disturbances:   WNL  Orientation: oriented to person, place, time/date, situation, day of week, month of year, and year  Attention: Good  Concentration: Good  Memory: WNL  Fund of knowledge:  Good  Insight:   Good  Judgment:  Good  Impulse Control: Good    Risk Assessment: Danger to Self:  No Self-injurious Behavior: No Danger to Others: No Duty to Warn:no Physical Aggression / Violence:No  Access to Firearms a concern: No  Gang Involvement:No    Eileen Donovan presented for her session in person to the [facility] location. Clinician was present in person. Patient consented to treatment.     Eileen Donovan presented for her session in a positive mood. She was just completing  her gym workout for the day which she is very consistent with doing. Eileen Donovan was pleased to show off her new backpack that she had been looking for and was  excited that it met all of her needs that she was looking for. Patient was also eager to report that her Soccer Mal, had signed with Eileen Donovan and is continuing to play soccer. Eileen Donovan reports that she has continued to do organization in her home and eliminate items to Eileen Donovan and trash. She states that she feels good about getting some control over her home and is feeling better about her space. She  reports that she has a cat that is 21 that is kidney failure. She is caring for him and reports that she is okay because she knows he has had a long life. She states that she will care for him until she knows it's time to have him put to sleep. Eileen Donovan reports that work continues to be stressor for her. She states that she is back to where she feels that she cares too much and that is a problem. She reports that she feels dismissed by several people that she must interact with and they do not respond to her emails. She states that she has gone to the owner and he sent an email and addressed some issues with the individuals and she is pleased at least that he addressed it.   Clinician actively listened and provided support via verbal interaction. Clinician continued to encourage patient to keep records of emails to protect herself and remind patient of the value she has to her employer based on her knowledge and skills. Clinician processed with patient her recent organization and the positive feeling that has given her. Patient reports that it a motivator to continue to organize and keep up on her already accomplished tasks. Eileen Donovan and Clinician processed how having structure in her life has improved motivation and helped her feel more accomplished in her daily life both at home and work.   Eileen Donovan was fully involved in discussion and processed with Clinician openly. Eileen Donovan continues to be active  and is attending another soccer game this weekend while visiting a friend in [place]. Eileen Donovan is focussed and motivated for treatment. She will continue to utilize coping skills to assist in decreasing her symptoms and will continue to engage in weekly therapy sessions.Treatment planning to be reviewed by 07/13/2024.  Interventions: Cognitive Behavioral Therapy, Assertiveness/Communication, Motivational Interviewing, Solution-Oriented/Positive Psychology, and Interpersonal  Diagnosis: Bipolar II   Eileen Donovan MSW, LCSW/DATE  12/22/2023

## 2023-12-28 ENCOUNTER — Ambulatory Visit: Admitting: Licensed Clinical Social Worker

## 2023-12-28 DIAGNOSIS — F3181 Bipolar II disorder: Secondary | ICD-10-CM

## 2023-12-28 NOTE — Progress Notes (Signed)
 [facility] Counselor/Therapist Progress Note  Patient ID: RENEISHA STILLEY, MRN: [id number]    Date: 12/28/23  Time Spent: 0800  am - 0902 am : 62 Minutes  Treatment Type: Individual Therapy.  Reported Symptoms: Patient reports a difficult childhood. Patient reports recent diagnosis of ADHD. 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 She states that over the past several years she has experienced suicidal ideations.    Mental Status Exam: Appearance:  Casual     Behavior: Appropriate  Motor: Normal  Speech/Language:  Clear and Coherent  Affect: Appropriate  Mood: normal  Thought process: normal  Thought content:   WNL  Sensory/Perceptual disturbances:   WNL  Orientation: oriented to person, place, time/date, situation, day of week, month of year, and year  Attention: Good  Concentration: Good  Memory: WNL  Fund of knowledge:  Good  Insight:   Good  Judgment:  Good  Impulse Control: Good    Risk Assessment: Danger to Self:  No Self-injurious Behavior: No Danger to Others: No Duty to Warn:no Physical Aggression / Violence:No  Access to Firearms a concern: No  Gang Involvement:No    Shuna MARLA Holm presented for her session in person to the [facility] location. Clinician was present in person. Patient consented to treatment.     Kimeka presented for her session reporting that she is having some pain in her arm and back due to her workouts. She continues to keep a regular fitness routine. Quinley states she has got all of her house projects completed with exceptions of a few drawers and cabinets to organize. She states she has donated things to charity and has got rid of boxes and additional clutter. Patient reports a good feeling of being in control of her space. She voiced concerns about binging on specific foods at times and stats currently it is bread pudding. She reports that she feels she has an issue with this and needs to improve self-control.    Clinician actively listened and provided support and feedback.  Clinician provided praise to patient for all of her efforts and accomplishments of getting these projects complete. Clinician and patient processed how her motivation has improved over the past few weeks and the good feeling it has given patient about her current place in life.  Clinician processed with patient the idea of eating a healthy balanced diet and possibly not bring in large quantities of the bread pudding. We discussed that a treat can be good but have so much in the home that it makes it more difficult to refrain from having some.   Alfhild was pleasant and cooperative. Poppi was fully engaged in session and was transparent about her struggles and motivated for treatment. Patient is to use CBT, mindfulness and coping skills to help manage decrease symptoms associated with their diagnosis. Marrietta will continue to engage in bi weekly therapy sessions. Treatment planning is to be reviewed by 09/07/2024.  Interventions: Cognitive Behavioral Therapy, Motivational Interviewing, and Solution-Oriented/Positive Psychology  Diagnosis: Bipolar II    Damien Junk MSW, LCSW/DATE 12/28/2023

## 2023-12-31 ENCOUNTER — Other Ambulatory Visit (HOSPITAL_BASED_OUTPATIENT_CLINIC_OR_DEPARTMENT_OTHER): Payer: Self-pay

## 2023-12-31 ENCOUNTER — Encounter (HOSPITAL_BASED_OUTPATIENT_CLINIC_OR_DEPARTMENT_OTHER): Payer: Self-pay

## 2024-01-05 ENCOUNTER — Other Ambulatory Visit (HOSPITAL_BASED_OUTPATIENT_CLINIC_OR_DEPARTMENT_OTHER): Payer: Self-pay

## 2024-01-05 ENCOUNTER — Ambulatory Visit: Admitting: Licensed Clinical Social Worker

## 2024-01-05 MED ORDER — LAMOTRIGINE 100 MG PO TABS
100.0000 mg | ORAL_TABLET | Freq: Every morning | ORAL | 0 refills | Status: DC
  Filled 2024-01-05 – 2024-01-28 (×2): qty 90, 90d supply, fill #0

## 2024-01-12 ENCOUNTER — Ambulatory Visit (INDEPENDENT_AMBULATORY_CARE_PROVIDER_SITE_OTHER): Admitting: Licensed Clinical Social Worker

## 2024-01-12 DIAGNOSIS — F3181 Bipolar II disorder: Secondary | ICD-10-CM

## 2024-01-12 NOTE — Progress Notes (Signed)
 [facility] Counselor/Therapist Progress Note  Patient ID: Eileen Donovan, MRN: [id number]    Date: 01/12/24  Time Spent: 0800  am - 0901 am : 61 Minutes  Treatment Type: Individual Therapy.  Reported Symptoms: Patient reports a difficult childhood. Patient reports recent diagnosis of ADHD. 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 She states that over the past several years she has experienced suicidal ideations.    Mental Status Exam: Appearance:  Casual     Behavior: Appropriate  Motor: Normal  Speech/Language:  Clear and Coherent  Affect: Appropriate  Mood: normal  Thought process: normal  Thought content:   WNL  Sensory/Perceptual disturbances:   WNL  Orientation: oriented to person, place, time/date, situation, day of week, month of year, and year  Attention: Good  Concentration: Good  Memory: WNL  Fund of knowledge:  Good  Insight:   Good  Judgment:  Good  Impulse Control: Good    Risk Assessment: Danger to Self:  No Self-injurious Behavior: No Danger to Others: No Duty to Warn:no Physical Aggression / Violence:No  Access to Firearms a concern: No  Gang Involvement:No    Eileen Donovan presented for her session in person to the [facility] location. Clinician was present in person. Patient consented to treatment.     Eileen Donovan presented for her session in a positive mood. She was excited to share the news about her favorite soccer player and his transition to [place]. Eileen Donovan reports that she is having ups and downs, but overall she states she is getting back up. She reports a meeting at work that was very stressful and not very productive. She reports that she is having to work on caring too much again. She states that she is going to do her job and try to focus on not letting what others are doing that affect the company as whole bother her. Eileen Donovan reports that she is working on cleaning and organizing her office today. Eileen Donovan states that she  continues to go to the gym and be active to assist in relieving some of her stress.  Clinician actively listened and provided encouragement and support via verbal feedback. Clinician processed with patient her feelings about her job and how she takes pride in her work. We discussed how caring too much can negatively impact her over all well being. Clinician encouraged patient to identify her job description and focus on what is required of her and allow the others to do the same. While remembering that she is only responsible for herself.   Eileen Donovan was fully engaged in session discussion and identified how she gets overwhelmed with work. She will continue to work toward setting personal limits with her work and continue to separate the time she is working from her time off. Eileen Donovan will Reduce overall level, frequency, and intensity of the feelings of depression and anxiety  evidenced by decreased irritability, negative self talk, and helpless feelings.Patient is to use CBT, mindfulness and coping skills to help manage decrease symptoms associated with their diagnosis. Treatment planning to be reviewed by 10/04/2024.  Interventions: Cognitive Behavioral Therapy, Dialectical Behavioral Therapy, Assertiveness/Communication, Mindfulness Meditation, Motivational Interviewing, and Solution-Oriented/Positive Psychology  Diagnosis: Bipolar II   Damien Junk MSW, LCSW/DATE 8/2//12/2023

## 2024-01-19 ENCOUNTER — Ambulatory Visit: Admitting: Licensed Clinical Social Worker

## 2024-01-20 ENCOUNTER — Other Ambulatory Visit: Payer: Self-pay

## 2024-01-20 ENCOUNTER — Other Ambulatory Visit (HOSPITAL_BASED_OUTPATIENT_CLINIC_OR_DEPARTMENT_OTHER): Payer: Self-pay

## 2024-01-20 MED ORDER — VENLAFAXINE HCL ER 37.5 MG PO CP24
ORAL_CAPSULE | ORAL | 0 refills | Status: DC
  Filled 2024-01-20: qty 21, 14d supply, fill #0

## 2024-01-20 MED ORDER — VENLAFAXINE HCL ER 150 MG PO CP24
150.0000 mg | ORAL_CAPSULE | Freq: Every day | ORAL | 0 refills | Status: DC
  Filled 2024-01-20: qty 30, 30d supply, fill #0

## 2024-01-20 MED ORDER — PAROXETINE HCL 10 MG PO TABS
ORAL_TABLET | ORAL | 0 refills | Status: AC
  Filled 2024-01-20: qty 42, 21d supply, fill #0

## 2024-01-20 MED ORDER — VENLAFAXINE HCL 37.5 MG PO TABS
ORAL_TABLET | ORAL | 0 refills | Status: DC
  Filled 2024-01-20: qty 21, 14d supply, fill #0

## 2024-01-25 ENCOUNTER — Ambulatory Visit (INDEPENDENT_AMBULATORY_CARE_PROVIDER_SITE_OTHER): Admitting: Licensed Clinical Social Worker

## 2024-01-25 DIAGNOSIS — F3181 Bipolar II disorder: Secondary | ICD-10-CM

## 2024-01-25 NOTE — Progress Notes (Signed)
 [facility] Counselor/Therapist Progress Note  Patient ID: Eileen Donovan, MRN: [id number]    Date: 01/25/24  Time Spent: 0806  am - 0900 am : 54 Minutes  Treatment Type: Individual Therapy.  Reported Symptoms: Patient reports a difficult childhood. Patient reports recent diagnosis of ADHD. 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 She states that over the past several years she has experienced suicidal ideations.    Mental Status Exam: Appearance:  Casual     Behavior: Appropriate  Motor: Normal  Speech/Language:  Clear and Coherent  Affect: Appropriate  Mood: normal  Thought process: normal  Thought content:   WNL  Sensory/Perceptual disturbances:   WNL  Orientation: oriented to person, place, time/date, situation, day of week, month of year, and year  Attention: Good  Concentration: Good  Memory: WNL  Fund of knowledge:  Good  Insight:   Good  Judgment:  Good  Impulse Control: Good    Risk Assessment: Danger to Self:  No Self-injurious Behavior: No Danger to Others: No Duty to Warn:no Physical Aggression / Violence:No  Access to Firearms a concern: No  Gang Involvement:No    Eileen Donovan presented for her session in person to the [facility] location. Clinician was present in person. Patient consented to treatment.      Eileen Donovan presented for her session in a positive mood. Eileen Donovan reports that she has had a good couple of weeks and she has remained on a good morning schedule but struggles with the afternoon schedule. Patient reports that she is working on improving her afternoon routine. Patient reports that she is working on not eating after a certain time and then brushing her teeth and preparing for bed. Patient reports that she functions better when she has a routine and also feels she eats healthier as well. Patient reports that she continues to have issues with people at work but she is trying to just focus on her job and not allow the others  lack of concern affect her performance.   Clinician was actively engaged and provided support and feedback via active listening and verbal feedback. Clinician processed with patient the importance of a routine and how everyone functions better with a set routine. Clinician praised patient for her insight and desire to continue to improve her daily life. Clinician and patient processed the stress at her work and how patient is doing her job and documenting which will be her protection if things go south for Eileen Donovan. 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  Eileen Donovan was fully engaged in discussion with Clinician and was insightful into ways to improve her quality of life. Eileen Donovan is motivated for treatment and desires change. Patient is to use CBT, mindfulness and coping skills to help manage decrease symptoms associated with their diagnosis.  Reduce overall level, frequency, and intensity of the feelings of depression and anxiety evidenced by decreased irritability, negative self talk, and helpless feelings from 6 to 7 days/week to 0 to 1 days/week per client report for at least 3 consecutive months. Treatment planning to be reviewed by 10/12/2024.   Interventions: Cognitive Behavioral Therapy, Dialectical Behavioral Therapy, Assertiveness/Communication, Motivational Interviewing, and Solution-Oriented/Positive Psychology  Diagnosis: Bipolar II Disorder   Damien Junk MSW, LCSW/DATE 01/25/2024

## 2024-01-28 ENCOUNTER — Other Ambulatory Visit (HOSPITAL_BASED_OUTPATIENT_CLINIC_OR_DEPARTMENT_OTHER): Payer: Self-pay

## 2024-02-02 ENCOUNTER — Ambulatory Visit: Admitting: Licensed Clinical Social Worker

## 2024-02-02 DIAGNOSIS — F3181 Bipolar II disorder: Secondary | ICD-10-CM

## 2024-02-02 NOTE — Progress Notes (Addendum)
 [facility] Counselor/Therapist Progress Note  Patient ID: Eileen Donovan, MRN: [id number]    Date: 02/02/24  Time Spent: 0800  am - 0859 am : 59 Minutes  Treatment Type: Individual Therapy.  Reported Symptoms: Patient reports a difficult childhood. Patient reports recent diagnosis of ADHD. 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 She states that over the past several years she has experienced suicidal ideations.    Mental Status Exam: Appearance:  Casual     Behavior: Appropriate  Motor: Normal  Speech/Language:  Clear and Coherent  Affect: Appropriate  Mood: normal  Thought process: normal  Thought content:   WNL  Sensory/Perceptual disturbances:   WNL  Orientation: oriented to person, place, time/date, situation, day of week, month of year, and year  Attention: Good  Concentration: Good  Memory: WNL  Fund of knowledge:  Good  Insight:   Good  Judgment:  Good  Impulse Control: Good    Risk Assessment: Danger to Self:  No Self-injurious Behavior: No Danger to Others: No Duty to Warn:no Physical Aggression / Violence:No  Access to Firearms a concern: No  Gang Involvement:No    Tyreonna MARLA Holm presented for her session in person to the [facility] location. Clinician was present in person. Patient consented to treatment.     Carita presented for her session and was in a positive mood. 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 Hanaa reports that she has been motivated to take care of her home. [place] states that she is working on being more motivated. Enslee states that she has been noticing that getting things in order has been a motivator for her on most days as she can look back and see what she has accomplished.   Clinician actively listened and processed with patient the advantages of routine and physical activity. Clinician and patient discussed how far she has came since the beginning of therapy. Clinician and patient  discussed what has been beneficial and patient recognized that being active and setting boundaries with work and getting organized.   Lyndzie was polite and cooperative and fully involved in session discussion with Clinician. Patient is motivated for treatment as identified through her active problem solving.  Genice will use CBT, mindfulness and coping skills to help manage decrease symptoms associated with their diagnosis. Treatment plan to be reviewed by 07/13/2024.  Interventions: Cognitive Behavioral Therapy, Dialectical Behavioral Therapy, Assertiveness/Communication, Mindfulness Meditation, Motivational Interviewing, and Solution-Oriented/Positive Psychology  Diagnosis: bipolar II disorder    Damien Junk MSW, LCSW/DATE 02/02/2024

## 2024-02-09 ENCOUNTER — Ambulatory Visit: Admitting: Licensed Clinical Social Worker

## 2024-02-10 ENCOUNTER — Other Ambulatory Visit (HOSPITAL_BASED_OUTPATIENT_CLINIC_OR_DEPARTMENT_OTHER): Payer: Self-pay

## 2024-02-10 MED ORDER — FLUZONE 0.5 ML IM SUSY
0.5000 mL | PREFILLED_SYRINGE | Freq: Once | INTRAMUSCULAR | 0 refills | Status: AC
  Filled 2024-02-10: qty 0.5, 1d supply, fill #0

## 2024-02-10 MED ORDER — COMIRNATY 30 MCG/0.3ML IM SUSY
0.3000 mL | PREFILLED_SYRINGE | Freq: Once | INTRAMUSCULAR | 0 refills | Status: AC
Start: 2024-02-10 — End: 2024-02-11
  Filled 2024-02-10: qty 0.3, 1d supply, fill #0

## 2024-02-16 ENCOUNTER — Other Ambulatory Visit: Payer: Self-pay

## 2024-02-16 ENCOUNTER — Ambulatory Visit (INDEPENDENT_AMBULATORY_CARE_PROVIDER_SITE_OTHER): Admitting: Licensed Clinical Social Worker

## 2024-02-16 ENCOUNTER — Other Ambulatory Visit (HOSPITAL_BASED_OUTPATIENT_CLINIC_OR_DEPARTMENT_OTHER): Payer: Self-pay

## 2024-02-16 DIAGNOSIS — F3181 Bipolar II disorder: Secondary | ICD-10-CM

## 2024-02-16 NOTE — Progress Notes (Addendum)
 [facility] Counselor/Therapist Progress Note  Patient ID: Eileen Donovan, MRN: [id number]    Date: 02/16/24  Time Spent: 0804  am - 0903 am : 59 Minutes  Treatment Type: Individual Therapy.  Reported Symptoms: Patient reports a difficult childhood. Patient reports recent diagnosis of ADHD. 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 She states that over the past several years she has experienced suicidal ideations.    Mental Status Exam: Appearance:  Casual     Behavior: Appropriate  Motor: Normal  Speech/Language:  Clear and Coherent  Affect: Appropriate  Mood: normal  Thought process: normal  Thought content:   WNL  Sensory/Perceptual disturbances:   WNL  Orientation: oriented to person, place, time/date, situation, day of week, month of year, and year  Attention: Good  Concentration: Good  Memory: WNL  Fund of knowledge:  Good  Insight:   Good  Judgment:  Good  Impulse Control: Good    Risk Assessment: Danger to Self:  No Self-injurious Behavior: No Danger to Others: No Duty to Warn:no Physical Aggression / Violence:No  Access to Firearms a concern: No  Gang Involvement:No    Maame MARLA Holm presented for her session via video from home. Patient is aware of risk and limitations and consented to treatment.  Clinician presented from home via video.  Meklit presented for her session in a positive mood. Cierah reports that she has been busy and setting goals for herself for both short term and long term. She reports that she has began working on her spending and has been doing quite well. Antrice states that she has a plan for both now and her paying off additional debt when she receives her bonus from work. Twala reports that she is coping better with work and the stress there. She has set limits and follows through with enforcing her boundaries. 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    Clinician was active in discussion via engagement and active listening. Clinician processed with patient her goals and the purpose behind her goals and what she has learned from her excess spending. Clinician and Shonnie also processed how she feels better since setting limits with work and maintaining them. Clinician provided patient with positive feedback regarding the improvement and accomplishments she has made.  Lenda was fully invested in session and was transparent about her feelings. Ameia will continue to make progress toward paying off debt and working on her health and fitness goals. Eda will continue to engage in CBT on a weekly basis and utilize coping skills. Treatment planning to be reviewed by 07/13/2024.    Interventions: Cognitive Behavioral Therapy, Dialectical Behavioral Therapy, Assertiveness/Communication, Motivational Interviewing, and Solution-Oriented/Positive Psychology  Diagnosis: Bipolar II   Damien Junk MSW, LCSW/DATE 02/16/2024

## 2024-02-21 ENCOUNTER — Other Ambulatory Visit (HOSPITAL_BASED_OUTPATIENT_CLINIC_OR_DEPARTMENT_OTHER): Payer: Self-pay

## 2024-02-21 MED ORDER — ZOLPIDEM TARTRATE 5 MG PO TABS
5.0000 mg | ORAL_TABLET | Freq: Every evening | ORAL | 0 refills | Status: DC | PRN
  Filled 2024-02-21: qty 30, 30d supply, fill #0

## 2024-02-22 ENCOUNTER — Ambulatory Visit: Admitting: Licensed Clinical Social Worker

## 2024-02-22 ENCOUNTER — Other Ambulatory Visit (HOSPITAL_BASED_OUTPATIENT_CLINIC_OR_DEPARTMENT_OTHER): Payer: Self-pay

## 2024-02-22 ENCOUNTER — Other Ambulatory Visit: Payer: Self-pay | Admitting: Physician Assistant

## 2024-02-22 ENCOUNTER — Ambulatory Visit
Admission: RE | Admit: 2024-02-22 | Discharge: 2024-02-22 | Disposition: A | Discharge status: Home / Self Care | Source: Ambulatory Visit | Attending: Physician Assistant | Admitting: Physician Assistant

## 2024-02-22 DIAGNOSIS — F3181 Bipolar II disorder: Secondary | ICD-10-CM

## 2024-02-22 DIAGNOSIS — M79645 Pain in left finger(s): Secondary | ICD-10-CM

## 2024-02-22 NOTE — Progress Notes (Signed)
  [facility] Counselor/Therapist Progress Note  Patient ID: Eileen Donovan, MRN: [id number]    Date: 02/22/24  Time Spent: 0759  am - 0900 am : 59 Minutes  Treatment Type: Individual Therapy.  Reported Symptoms: Patient reports a difficult childhood. Patient reports recent diagnosis of ADHD. 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 She states that over the past several years she has experienced suicidal ideations.    Mental Status Exam: Appearance:  Casual     Behavior: Appropriate  Motor: Normal  Speech/Language:  Clear and Coherent  Affect: Appropriate  Mood: normal  Thought process: normal  Thought content:   WNL  Sensory/Perceptual disturbances:   WNL  Orientation: oriented to person, place, time/date, situation, day of week, month of year, and year  Attention: Good  Concentration: Good  Memory: WNL  Fund of knowledge:  Good  Insight:   Good  Judgment:  Good  Impulse Control: Good    Risk Assessment: Danger to Self:  No Self-injurious Behavior: No Danger to Others: No Duty to Warn:no Physical Aggression / Violence:No  Access to Firearms a concern: No  Gang Involvement:No    Eileen Donovan presented for her session in person located at [employer]. Eileen Donovan consented to treatment.  Clinician presented from [facility].  Eileen Donovan presented for her session wearing her newest soccer [place]. She is eager to talk about her favorite soccer player from [place] that moved to [place]. Eileen Donovan was very upbeat and shared that she has been taking the medication for the weight loss but has had issues with upset stomach. She reports that her doctor has given her some medication to help with those symptoms. Eileen Donovan states that she has been doing some home improvements and that is keeping her busy and productive. She reports that she is doing well with keeping a routine and focussing on her task. Eileen Donovan reports a decline in symptoms of depression and anxiety and states she  is feeling better over all about life.  Clinician actively listened and processed with patient her thoughts and concerns abou the medication. Clinician encouraged patient to continue to keep her medical provider informed about her reaction to the medications. Clinician provided encouragement and praise for how patient has taken control of her daily life and built a routine that is beneficial to her and her success in improving her mental and physical health.  Eileen Donovan was fully invested in session and was transparent about her feelings. Eileen Donovan will continue to make progress toward paying off debt and working on her health and fitness goals. Eileen Donovan will continue to engage in CBT on a weekly basis and utilize coping skills. Treatment planning to be reviewed by 07/13/2024.     Interventions: Cognitive Behavioral Therapy, Dialectical Behavioral Therapy, Assertiveness/Communication, Motivational Interviewing, and Solution-Oriented/Positive Psychology   Diagnosis: Bipolar II   Damien Junk MSW, LCSW/DATE 02/22/2024

## 2024-02-23 ENCOUNTER — Ambulatory Visit: Admitting: Licensed Clinical Social Worker

## 2024-02-23 ENCOUNTER — Other Ambulatory Visit (HOSPITAL_BASED_OUTPATIENT_CLINIC_OR_DEPARTMENT_OTHER): Payer: Self-pay

## 2024-02-23 MED ORDER — LAMOTRIGINE 100 MG PO TABS: 100.0000 mg | ORAL_TABLET | Freq: Every morning | ORAL | 0 refills | Status: DC

## 2024-02-23 MED ORDER — VENLAFAXINE HCL ER 150 MG PO CP24
150.0000 mg | ORAL_CAPSULE | Freq: Every day | ORAL | 0 refills | Status: DC
  Filled 2024-02-23: qty 90, 90d supply, fill #0

## 2024-03-01 ENCOUNTER — Ambulatory Visit: Admitting: Licensed Clinical Social Worker

## 2024-03-01 DIAGNOSIS — F3181 Bipolar II disorder: Secondary | ICD-10-CM | POA: Diagnosis not present

## 2024-03-01 NOTE — Progress Notes (Signed)
 [facility] Counselor/Therapist Progress Note  Patient ID: Eileen Donovan, MRN: [id number]    Date: 03/01/24  Time Spent: 0803  am - 0900 am : 57 Minutes  Treatment Type: Individual Therapy.  Reported Symptoms: Patient reports a difficult childhood. Patient reports recent diagnosis of ADHD. 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 She states that over the past several years she has experienced suicidal ideations.    Mental Status Exam: Appearance:  Casual     Behavior: Appropriate  Motor: Normal  Speech/Language:  Clear and Coherent  Affect: Appropriate  Mood: normal  Thought process: normal  Thought content:   WNL  Sensory/Perceptual disturbances:   WNL  Orientation: oriented to person, place, time/date, situation, day of week, month of year, and year  Attention: Good  Concentration: Good  Memory: WNL  Fund of knowledge:  Good  Insight:   Good  Judgment:  Good  Impulse Control: Good    Risk Assessment: Danger to Self:  No Self-injurious Behavior: No Danger to Others: No Duty to Warn:no Physical Aggression / Violence:No  Access to Firearms a concern: No  Gang Involvement:No    Eileen Donovan presented for her session in person located at [employer]. Eileen Donovan consented to treatment.  Clinician presented from [facility].   Eileen Donovan presented for her session in a positive mood. She reports that she has had a good week. She has hurt her left pointer finger. She states that she will wear the splint for approximately 4 weeks. Patient reports that she has remained motivated. She is keeping up with her responsibilities. She reports she is reading a book between the body and mind. She reports that she is working through her depression. Patient reports that she is now officially switched to the Effexor  and Lamictal . She reports that she continues to use the weight loss medication. She reports that she has nausea the first couple of days but states she is  thinking it is going well. She is monitoring that she doesn't lose too much weight. Patient states that she feels work is going well.   Clinician actively listened and provided support and encouragement. Clinician provided insight into ideas for remaining motivated. Clinician and patient processed ideas  for remaining calm and positive at work. Clinician and patient processed the role that her remaining positive and busy with her mental health.   Eileen Donovan was fully invested in session and was transparent about her feelings. Eileen Donovan will continue to make progress toward paying off debt and working on her health and fitness goals. Eileen Donovan will continue to engage in CBT on a weekly basis and utilize coping skills. Treatment planning to be reviewed by 07/13/2024.     Interventions: Cognitive Behavioral Therapy, Dialectical Behavioral Therapy, Assertiveness/Communication, Motivational Interviewing, and Solution-Oriented/Positive Psychology   Diagnosis: Bipolar II   Damien Junk MSW, LCSW/DATE 03/01/2024

## 2024-03-08 ENCOUNTER — Ambulatory Visit: Admitting: Licensed Clinical Social Worker

## 2024-03-15 ENCOUNTER — Ambulatory Visit: Admitting: Licensed Clinical Social Worker

## 2024-03-15 DIAGNOSIS — F3181 Bipolar II disorder: Secondary | ICD-10-CM | POA: Diagnosis not present

## 2024-03-15 NOTE — Progress Notes (Signed)
 [facility] Counselor/Therapist Progress Note  Patient ID: OLUWASEYI TULL, MRN: [id number]    Date: 03/15/24  Time Spent: 0803  am - 0900 am : 57 Minutes  Treatment Type: Individual Therapy.  Reported Symptoms: Patient reports a difficult childhood. Patient reports recent diagnosis of ADHD. 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 She states that over the past several years she has experienced suicidal ideations.    Mental Status Exam: Appearance:  Casual     Behavior: Appropriate  Motor: Normal  Speech/Language:  Clear and Coherent  Affect: Appropriate  Mood: normal  Thought process: normal  Thought content:   WNL  Sensory/Perceptual disturbances:   WNL  Orientation: oriented to person, place, time/date, situation, day of week, month of year, and year  Attention: Good  Concentration: Good  Memory: WNL  Fund of knowledge:  Good  Insight:   Good  Judgment:  Good  Impulse Control: Good    Risk Assessment: Danger to Self:  No Self-injurious Behavior: No Danger to Others: No Duty to Warn:no Physical Aggression / Violence:No  Access to Firearms a concern: No  Gang Involvement:No    Mishti MARLA Holm presented for her session in person located at [employer]. Petrea consented to treatment.  Clinician presented from [facility].   Caresse presented for her session in a good mood. She reports that she has been busy and keeping up with her daily routine. She reports that she has a few things that has fallen behind but she  plans to catch up on those today. She states that she continues to remain frustrated at work. She reports that although she handles it better she remains frustrated at co workers for their lack of understanding. Lalisa reports that she is improving with her finances and feels that she is getting her debt paid and continues to work to pay the rest.  Clinician actively listened and processed with patient her concerns with work and co workers.  Clinician pointed out to focus on your own work, document the issues factually, and communicate with nurse, children's. Avoid gossip and confrontational behavior; instead, try to understand potential underlying issues, set boundaries, and don't cover for them. If the behavior persists, a structured conversation with your manager is the next step.  For your own well-being and workflow Focus on your own work: Remain productive and maintain high standards in your own tasks. Set boundaries: Avoid taking on their work or enabling their behavior by constantly covering for them. Document facts: Keep a factual record of instances where their lack of performance impacts your work. Note specific tasks and the time spent dealing with them, but avoid emotional language. Avoid gossip: Do not engage in or spread workplace gossip, as this can escalate conflict and harm your professional reputation. Don't take it personally: Recognize that the coworker's performance is not a reflection of your own abilities and try to stay dispassionate.   Izzabell was fully invested in session and was transparent about her feelings. Randalyn will continue to make progress toward paying off debt and working on her health and fitness goals. Janete will continue to engage in CBT on a weekly basis and utilize coping skills. Treatment planning to be reviewed by 07/13/2024.     Interventions: Cognitive Behavioral Therapy, Dialectical Behavioral Therapy, Assertiveness/Communication, Motivational Interviewing, and Solution-Oriented/Positive Psychology   Diagnosis: Bipolar II      Damien Junk MSW, LCSW/DATE 03/15/2024

## 2024-03-22 ENCOUNTER — Ambulatory Visit: Admitting: Licensed Clinical Social Worker

## 2024-03-22 DIAGNOSIS — F3181 Bipolar II disorder: Secondary | ICD-10-CM

## 2024-03-22 NOTE — Progress Notes (Signed)
 [facility] Counselor/Therapist Progress Note  Patient ID: TREVA HUYETT, MRN: [id number]    Date: 03/22/24  Time Spent: 0802  am - 0900 am : 58 Minutes  Treatment Type: Individual Therapy.  Reported Symptoms: Patient reports a difficult childhood. Patient reports recent diagnosis of ADHD. 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 She states that over the past several years she has experienced suicidal ideations.    Mental Status Exam: Appearance:  Casual     Behavior: Appropriate  Motor: Normal  Speech/Language:  Clear and Coherent  Affect: Appropriate  Mood: normal  Thought process: normal  Thought content:   WNL  Sensory/Perceptual disturbances:   WNL  Orientation: oriented to person, place, time/date, situation, day of week, month of year, and year  Attention: Good  Concentration: Good  Memory: WNL  Fund of knowledge:  Good  Insight:   Good  Judgment:  Good  Impulse Control: Good    Risk Assessment: Danger to Self:  No Self-injurious Behavior: No Danger to Others: No Duty to Warn:no Physical Aggression / Violence:No  Access to Firearms a concern: No  Gang Involvement:No    Mercedees MARLA Holm presented for her session in person located at [employer]. Soila consented to treatment.  Clinician presented from [facility].  Victorya presented for her session in a positive mood. She reports that she has another sick cat but he is healing after two pet visits. She reports that she is also seeing an improvement in her finances. She reports that she is struggling with still being dizzy. She reports that she is thinking that it is because she has been handling a thyroid  pill that belongs to her cat. She reports that it states to not touch it without gloves.    Clinician actively listened and provided support via verbal feedback. Clinician encouraged patient ot consult with her  provider about her not feeling well. Clinician and patient processed her  eating habits. We discussed the importance of keeping an eye on her health situation. We discussed a blan diet and keep in touch with provider.   Judine was fully invested in session and was transparent about her feelings. Juliya will continue to make progress toward paying off debt and working on her health and fitness goals. Keyon will continue to engage in CBT on a weekly basis and utilize coping skills. Treatment planning to be reviewed by 07/13/2024.     Interventions: Cognitive Behavioral Therapy, Dialectical Behavioral Therapy, Assertiveness/Communication, Motivational Interviewing, and Solution-Oriented/Positive Psychology   Diagnosis: Bipolar II       Damien Junk MSW, LCSW/DATE 03/22/2024

## 2024-03-28 ENCOUNTER — Ambulatory Visit: Admitting: Licensed Clinical Social Worker

## 2024-04-05 ENCOUNTER — Ambulatory Visit (INDEPENDENT_AMBULATORY_CARE_PROVIDER_SITE_OTHER): Admitting: Licensed Clinical Social Worker

## 2024-04-05 DIAGNOSIS — F3181 Bipolar II disorder: Secondary | ICD-10-CM

## 2024-04-05 NOTE — Progress Notes (Signed)
 [facility] Counselor/Therapist Progress Note  Patient ID: Eileen Donovan, MRN: [id number]    Date: 04/05/24  Time Spent: 0803  am - 0901 am : 58 Minutes  Treatment Type: Individual Therapy.  Treatment Type: Individual Therapy.   Reported Symptoms: Patient reports a difficult childhood. Patient reports recent diagnosis of ADHD. 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 She states that over the past several years she has experienced suicidal ideations.    Mental Status Exam: Appearance:  Casual     Behavior: Appropriate  Motor: Normal  Speech/Language:  Clear and Coherent  Affect: Appropriate  Mood: normal  Thought process: normal  Thought content:   WNL  Sensory/Perceptual disturbances:   WNL  Orientation: oriented to person, place, time/date, situation, day of week, month of year, and year  Attention: Good  Concentration: Good  Memory: WNL  Fund of knowledge:  Good  Insight:   Good  Judgment:  Good  Impulse Control: Good    Risk Assessment: Danger to Self:  No Self-injurious Behavior: No Danger to Others: No Duty to Warn:no Physical Aggression / Violence:No  Access to Firearms a concern: No  Gang Involvement:No    Eileen Donovan presented for her session in person located at [employer]. Eileen Donovan consented to treatment.  Clinician presented from [facility].   Eileen Donovan presented for her session stating that she emailed her PCP and discussed the previous dizzy spells and nausea. She states that by the weekend she felt better. She states that the PCP told her to give it a few days. She states that she did feel much better and determined that it was the medication. She states that she has been improving her diet and it has been helpful.   Clinician actively listened and provided support via verbal feedback. Clinician and patient discussed the advantages of a good diet. healthy diet provides numerous benefits, including a reduced risk of chronic  diseases like heart disease and diabetes, improved weight management, increased energy levels, and enhanced mental well-being. It also strengthens the immune system, supports better digestive health, and can improve skin and sleep quality   Eileen Donovan was fully invested in session and was transparent about her feelings. Eileen Donovan will continue to make progress toward paying off debt and working on her health and fitness goals. Eileen Donovan will continue to engage in CBT on a weekly basis and utilize coping skills. Treatment planning to be reviewed by 07/13/2024.     Interventions: Cognitive Behavioral Therapy, Dialectical Behavioral Therapy, Assertiveness/Communication, Motivational Interviewing, and Solution-Oriented/Positive Psychology   Damien Junk MSW, LCSW/DATE 04/05/2024

## 2024-04-19 ENCOUNTER — Ambulatory Visit: Admitting: Licensed Clinical Social Worker

## 2024-04-25 ENCOUNTER — Ambulatory Visit: Admitting: Licensed Clinical Social Worker

## 2024-05-03 ENCOUNTER — Ambulatory Visit (INDEPENDENT_AMBULATORY_CARE_PROVIDER_SITE_OTHER): Admitting: Licensed Clinical Social Worker

## 2024-05-03 DIAGNOSIS — F3181 Bipolar II disorder: Secondary | ICD-10-CM | POA: Diagnosis not present

## 2024-05-03 NOTE — Progress Notes (Unsigned)
 [facility] Counselor/Therapist Progress Note  Patient ID: Eileen Donovan, MRN: [id number]    Date: 05/03/2024  Time Spent: ***  {LBBHAMPM:26719} - *** {LBBHAMPM:26719} : *** Minutes  Treatment Type: Individual Therapy.  Reported Symptoms: ***  Mental Status Exam: Appearance:  {PSY:22683}     Behavior: {PSY:21022743}  Motor: {PSY:22302}  Speech/Language:  {PSY:22685}  Affect: {PSY:22687}  Mood: {PSY:31886}  Thought process: {PSY:31888}  Thought content:   {PSY:346-069-2959}  Sensory/Perceptual disturbances:   {[phone]}  Orientation: {PSY:30297}  Attention: {PSY:22877}  Concentration: {PSY:463-412-3339}  Memory: {PSY:6462031616}  Fund of knowledge:  {PSY:463-412-3339}  Insight:   {PSY:463-412-3339}  Judgment:  {PSY:463-412-3339}  Impulse Control: {PSY:463-412-3339}   Risk Assessment: Danger to Self:  {PSY:22692} Self-injurious Behavior: {PSY:22692} Danger to Others: {PSY:22692} Duty to Warn:{PSY:311194} Physical Aggression / Violence:{PSY:21197} Access to Firearms a concern: {PSY:21197} Gang Involvement:{PSY:21197}  Subjective:   Eileen Donovan participated from {Patient Location:26691::home}, via {LBBHVIDEOORPHONE:26720}, and consented to treatment. Therapist participated from {LBBHPROVIDERLOCATION:26721}. We met online due to COVID pandemic.   ***   Interventions: {PSY:4158233797}  Diagnosis: No diagnosis found.   Plan: ***Patient is to use CBT, mindfulness and coping skills to help manage decrease symptoms associated with their diagnosis.   Long-term goal:   ***Reduce overall level, frequency, and intensity of the feelings of depression, anxiety and panic evidenced by       decreased irritability, negative self talk, and helpless feelings from 6 to 7 days/week to 0 to 1 days/week per client report for at least 3 consecutive months.  Short-term goal:  ***Verbally express understanding of the relationship between feelings of depression, anxiety and  their impact on thinking patterns and behaviors. Verbalize an understanding of the role that distorted thinking plays in creating fears, excessive worry, and ruminations.  Damien Junk MSW, LCSW/DATE

## 2024-05-22 NOTE — Progress Notes (Signed)
 "  59 y.o. G0P0000 Single Caucasian female here for annual exam.    No vaginal bleeding.   No vulvar symptoms.  Took flu and Covid vaccines.    Exploring new work opportunities.  Stress at work.   Has support through counseling.   PCP: Leonel Cole, MD   Patient's last menstrual period was 08/15/2020.           Sexually active: No.  The current method of family planning is post menopausal status.    Menopausal hormone therapy:  n/a Exercising: Yes.    Teaching cycling class, strength training, cardio, water aerobics  - [employer] Smoker:  no  OB History  Gravida Para Term Preterm AB Living  0 0 0 0 0 0  SAB IAB Ectopic Multiple Live Births  0 0 0 0 0     HEALTH MAINTENANCE: Last 2 paps:  02/02/22 neg, HR HPV neg, 12/21/17 neg  History of abnormal Pap or positive HPV:  no Mammogram:   12/14/23 Breast Density Cat B, BIRADS Cat 1 neg  Colonoscopy:  Going to call to schedule  Bone Density:  08/10/21  Result  low bone mass    Immunization History  Administered Date(s) Administered   Influenza, Seasonal, Injecte, Preservative Fre 02/10/2024   Influenza,inj,Quad PF,6+ Mos 02/07/2017   Influenza-Unspecified 12/26/2016   Moderna Sars-Covid-2 Vaccination 08/21/2019, 09/18/2019, 04/25/2020, 02/09/2023   PFIZER Comirnaty Alejos Top)Covid-19 Tri-Sucrose Vaccine 03/31/2022   PNEUMOCOCCAL CONJUGATE-20 05/25/2023   Pfizer(Comirnaty )Fall Seasonal Vaccine 12 years and older 01/31/2021, 02/10/2024      reports that she has never smoked. She has never been exposed to tobacco smoke. She has never used smokeless tobacco. She reports that she does not currently use alcohol. She reports that she does not use drugs.  Past Medical History:  Diagnosis Date   ADHD    Colon polyps 02/2010   benign   Depression    Environmental allergies    Fibroids    History of suicidal tendencies    Osteopenia 01/2016   T score -1.3 FRAX 3.8%/0.3%   Vitiligo     Past Surgical History:  Procedure  Laterality Date   APPENDECTOMY     MOUTH SURGERY      Current Outpatient Medications  Medication Sig Dispense Refill   Calcium Carbonate-Vitamin D  (CALCIUM + D PO) Take by mouth.     CREATINE PO Take by mouth.     CYCLOBENZAPRINE HCL PO Take by mouth.     lamoTRIgine  (LAMICTAL ) 100 MG tablet 1 tablet Orally daily; Duration: 30 days     meloxicam  (MOBIC ) 15 MG tablet Take 1 tablet (15 mg total) by mouth daily as needed. 30 tablet 0   venlafaxine  XR (EFFEXOR  XR) 150 MG 24 hr capsule Take 1 capsule (150 mg total) by mouth daily. 90 capsule 0   zolpidem  (AMBIEN ) 5 MG tablet 1 tablet at bedtime as needed Orally at bedtime as needed for sleep     No current facility-administered medications for this visit.    ALLERGIES: Sudafed [pseudoephedrine hcl]  Family History  Problem Relation Age of Onset   Hyperlipidemia Mother    Heart disease Mother    Heart attack Mother    Stroke Father     Review of Systems  All other systems reviewed and are negative.   PHYSICAL EXAM:  BP 122/78 (BP Location: Left Arm, Patient Position: Sitting)   Pulse 67   Ht 5' 8 (1.727 m)   Wt 170 lb (77.1 kg)   LMP 08/15/2020  SpO2 97%   BMI 25.85 kg/m     General appearance: alert, cooperative and appears stated age Head: normocephalic, without obvious abnormality, atraumatic Neck: no adenopathy, supple, symmetrical, trachea midline and thyroid  normal to inspection and palpation Lungs: clear to auscultation bilaterally Breasts: normal appearance, no masses or tenderness, No nipple retraction or dimpling, No nipple discharge or bleeding, No axillary adenopathy Heart: regular rate and rhythm Abdomen: soft, non-tender; no masses, no organomegaly Extremities: extremities normal, atraumatic, no cyanosis or edema Skin: skin color, texture, turgor normal. No rashes or lesions Lymph nodes: cervical, supraclavicular, and axillary nodes normal. Neurologic: grossly normal  Pelvic: External genitalia:  hypo  and hyperpigmentation present.              No abnormal inguinal nodes palpated.              Urethra:  normal appearing urethra with no masses, tenderness or lesions              Bartholins and Skenes: normal                 Vagina: normal appearing vagina with normal color and discharge, no lesions              Cervix: no lesions              Pap taken: no Bimanual Exam:  Uterus:  normal size, contour, position, consistency, mobility, non-tender              Adnexa: no mass, fullness, tenderness              Rectal exam: yes.  Confirms.              Anus:  normal sphincter tone, no lesions  Chaperone was present for exam:  Clotilda DEL, CMA  ASSESSMENT: Well woman visit with gynecologic exam. Lichen sclerosus.  Asymptomatic.  Vitiligo. Osteopenia.  PHQ-2-9: 0  PLAN: Mammogram screening discussed. Self breast awareness reviewed. Pap and HRV collected:  no.  Due in 2028.  Guidelines for Calcium, Vitamin D , regular exercise program including cardiovascular and weight bearing exercise. Medication refills:  NA Dexa at [facility].  Patient given phone number to call and schedule. Labs with PCP.  Follow up:  yearly and prn.          "

## 2024-05-23 ENCOUNTER — Ambulatory Visit: Admitting: Licensed Clinical Social Worker

## 2024-05-24 ENCOUNTER — Other Ambulatory Visit (HOSPITAL_BASED_OUTPATIENT_CLINIC_OR_DEPARTMENT_OTHER): Payer: Self-pay

## 2024-05-24 ENCOUNTER — Other Ambulatory Visit: Payer: Self-pay

## 2024-05-24 ENCOUNTER — Encounter: Payer: Self-pay | Admitting: Obstetrics and Gynecology

## 2024-05-24 ENCOUNTER — Ambulatory Visit (INDEPENDENT_AMBULATORY_CARE_PROVIDER_SITE_OTHER): Payer: Self-pay | Admitting: Obstetrics and Gynecology

## 2024-05-24 VITALS — BP 122/78 | HR 67 | Ht 68.0 in | Wt 170.0 lb

## 2024-05-24 DIAGNOSIS — Z1331 Encounter for screening for depression: Secondary | ICD-10-CM | POA: Diagnosis not present

## 2024-05-24 DIAGNOSIS — M8580 Other specified disorders of bone density and structure, unspecified site: Secondary | ICD-10-CM | POA: Diagnosis not present

## 2024-05-24 DIAGNOSIS — Z01419 Encounter for gynecological examination (general) (routine) without abnormal findings: Secondary | ICD-10-CM | POA: Diagnosis not present

## 2024-05-24 MED ORDER — VENLAFAXINE HCL ER 150 MG PO CP24
150.0000 mg | ORAL_CAPSULE | Freq: Every day | ORAL | 0 refills | Status: AC
  Filled 2024-05-24: qty 90, 90d supply, fill #0

## 2024-05-24 MED ORDER — LAMOTRIGINE 100 MG PO TABS
100.0000 mg | ORAL_TABLET | Freq: Every morning | ORAL | 0 refills | Status: DC
  Filled 2024-05-24: qty 90, 90d supply, fill #0

## 2024-05-24 NOTE — Patient Instructions (Signed)
 Phone number for scheduling bone density at [facility], [phone].    EXERCISE AND DIET:  We recommended that you start or continue a regular exercise program for good health. Regular exercise means any activity that makes your heart beat faster and makes you sweat.  We recommend exercising at least 30 minutes per day at least 3 days a week, preferably 4 or 5.  We also recommend a diet low in fat and sugar.  Inactivity, poor dietary choices and obesity can cause diabetes, heart attack, stroke, and kidney damage, among others.    ALCOHOL AND SMOKING:  Women should limit their alcohol intake to no more than 7 drinks/beers/glasses of wine (combined, not each!) per week. Moderation of alcohol intake to this level decreases your risk of breast cancer and liver damage. And of course, no recreational drugs are part of a healthy lifestyle.  And absolutely no smoking or even second hand smoke. Most people know smoking can cause heart and lung diseases, but did you know it also contributes to weakening of your bones? Aging of your skin?  Yellowing of your teeth and nails?  CALCIUM AND VITAMIN D:  Adequate intake of calcium and Vitamin D are recommended.  The recommendations for exact amounts of these supplements seem to change often, but generally speaking 600 mg of calcium (either carbonate or citrate) and 800 units of Vitamin D per day seems prudent. Certain women may benefit from higher intake of Vitamin D.  If you are among these women, your doctor will have told you during your visit.    PAP SMEARS:  Pap smears, to check for cervical cancer or precancers,  have traditionally been done yearly, although recent scientific advances have shown that most women can have pap smears less often.  However, every woman still should have a physical exam from her gynecologist every year. It will include a breast check, inspection of the vulva and vagina to check for abnormal growths or skin changes, a visual exam of the  cervix, and then an exam to evaluate the size and shape of the uterus and ovaries.  And after 57 years of age, a rectal exam is indicated to check for rectal cancers. We will also provide age appropriate advice regarding health maintenance, like when you should have certain vaccines, screening for sexually transmitted diseases, bone density testing, colonoscopy, mammograms, etc.   MAMMOGRAMS:  All women over 57 years old should have a yearly mammogram. Many facilities now offer a "3D" mammogram, which may cost around $50 extra out of pocket. If possible,  we recommend you accept the option to have the 3D mammogram performed.  It both reduces the number of women who will be called back for extra views which then turn out to be normal, and it is better than the routine mammogram at detecting truly abnormal areas.    COLONOSCOPY:  Colonoscopy to screen for colon cancer is recommended for all women at age 57.  We know, you hate the idea of the prep.  We agree, BUT, having colon cancer and not knowing it is worse!!  Colon cancer so often starts as a polyp that can be seen and removed at colonscopy, which can quite literally save your life!  And if your first colonoscopy is normal and you have no family history of colon cancer, most women don't have to have it again for 10 years.  Once every ten years, you can do something that may end up saving your life, right?  We will  be happy to help you get it scheduled when you are ready.  Be sure to check your insurance coverage so you understand how much it will cost.  It may be covered as a preventative service at no cost, but you should check your particular policy.

## 2024-05-29 ENCOUNTER — Other Ambulatory Visit (HOSPITAL_BASED_OUTPATIENT_CLINIC_OR_DEPARTMENT_OTHER): Payer: Self-pay

## 2024-05-29 MED ORDER — BISACODYL EC 5 MG PO TBEC
DELAYED_RELEASE_TABLET | ORAL | 0 refills | Status: AC
  Filled 2024-05-29: qty 4, 1d supply, fill #0

## 2024-05-29 MED ORDER — PEG-3350/ELECTROLYTES 236 G PO SOLR
ORAL | 0 refills | Status: AC
  Filled 2024-05-29: qty 4000, 1d supply, fill #0

## 2024-05-30 ENCOUNTER — Other Ambulatory Visit (HOSPITAL_BASED_OUTPATIENT_CLINIC_OR_DEPARTMENT_OTHER): Payer: Self-pay

## 2024-05-30 MED ORDER — LAMOTRIGINE 100 MG PO TABS
100.0000 mg | ORAL_TABLET | Freq: Every morning | ORAL | 0 refills | Status: AC
  Filled 2024-05-30: qty 90, 90d supply, fill #0

## 2024-05-31 ENCOUNTER — Ambulatory Visit: Admitting: Licensed Clinical Social Worker

## 2024-05-31 DIAGNOSIS — F3181 Bipolar II disorder: Secondary | ICD-10-CM | POA: Diagnosis not present
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
## 2024-06-07 ENCOUNTER — Ambulatory Visit: Admitting: Licensed Clinical Social Worker

## 2024-06-07 DIAGNOSIS — F3181 Bipolar II disorder: Secondary | ICD-10-CM
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
## 2024-06-13 ENCOUNTER — Ambulatory Visit: Admitting: Licensed Clinical Social Worker

## 2024-06-13 DIAGNOSIS — F319 Bipolar disorder, unspecified: Secondary | ICD-10-CM

## 2024-06-13 DIAGNOSIS — F3181 Bipolar II disorder: Secondary | ICD-10-CM
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
## 2024-06-14 ENCOUNTER — Ambulatory Visit: Admitting: Licensed Clinical Social Worker

## 2024-06-21 ENCOUNTER — Ambulatory Visit: Admitting: Licensed Clinical Social Worker

## 2024-06-21 DIAGNOSIS — F3181 Bipolar II disorder: Secondary | ICD-10-CM
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
## 2024-06-21 NOTE — Addendum Note (Signed)
 Addended by: SCHUYLER DAMIEN HERO on: 06/21/2024 10:03 AM   Modules accepted: Level of Service

## 2024-06-27 ENCOUNTER — Ambulatory Visit: Admitting: Licensed Clinical Social Worker

## 2024-07-05 ENCOUNTER — Ambulatory Visit: Admitting: Licensed Clinical Social Worker

## 2024-07-12 ENCOUNTER — Ambulatory Visit: Admitting: Licensed Clinical Social Worker

## 2024-07-19 ENCOUNTER — Ambulatory Visit: Admitting: Licensed Clinical Social Worker

## 2024-07-25 ENCOUNTER — Ambulatory Visit: Admitting: Licensed Clinical Social Worker

## 2024-08-02 ENCOUNTER — Ambulatory Visit: Admitting: Licensed Clinical Social Worker

## 2024-08-09 ENCOUNTER — Ambulatory Visit: Admitting: Licensed Clinical Social Worker

## 2024-08-16 ENCOUNTER — Ambulatory Visit: Admitting: Licensed Clinical Social Worker

## 2025-05-28 ENCOUNTER — Ambulatory Visit: Admitting: Obstetrics and Gynecology
